# Patient Record
Sex: Female | Born: 1960 | Race: White | Hispanic: No | Marital: Married | Smoking: Current every day smoker
Health system: Southern US, Community
[De-identification: ages and names within clinical notes are randomized; demographics above are authoritative.]

## PROBLEM LIST (undated history)

## (undated) DIAGNOSIS — K219 Gastro-esophageal reflux disease without esophagitis: Secondary | ICD-10-CM

## (undated) DIAGNOSIS — M797 Fibromyalgia: Secondary | ICD-10-CM

## (undated) DIAGNOSIS — M810 Age-related osteoporosis without current pathological fracture: Secondary | ICD-10-CM

## (undated) DIAGNOSIS — M1990 Unspecified osteoarthritis, unspecified site: Secondary | ICD-10-CM

## (undated) DIAGNOSIS — M4802 Spinal stenosis, cervical region: Secondary | ICD-10-CM

## (undated) DIAGNOSIS — K2210 Ulcer of esophagus without bleeding: Secondary | ICD-10-CM

## (undated) DIAGNOSIS — E039 Hypothyroidism, unspecified: Secondary | ICD-10-CM

## (undated) DIAGNOSIS — K589 Irritable bowel syndrome without diarrhea: Secondary | ICD-10-CM

## (undated) DIAGNOSIS — Z87448 Personal history of other diseases of urinary system: Secondary | ICD-10-CM

## (undated) DIAGNOSIS — G43909 Migraine, unspecified, not intractable, without status migrainosus: Secondary | ICD-10-CM

## (undated) HISTORY — PX: ABDOMINAL HYSTERECTOMY: SHX81

## (undated) HISTORY — DX: Unspecified osteoarthritis, unspecified site: M19.90

## (undated) HISTORY — PX: CARPAL TUNNEL RELEASE: SHX101

## (undated) HISTORY — PX: LAPAROSCOPIC CHOLECYSTECTOMY: SUR755

## (undated) HISTORY — DX: Age-related osteoporosis without current pathological fracture: M81.0

## (undated) HISTORY — DX: Fibromyalgia: M79.7

## (undated) HISTORY — PX: COLONOSCOPY W/ BIOPSIES AND POLYPECTOMY: SHX1376

## (undated) HISTORY — PX: SHOULDER SURGERY: SHX246

## (undated) HISTORY — DX: Personal history of other diseases of urinary system: Z87.448

## (undated) HISTORY — DX: Migraine, unspecified, not intractable, without status migrainosus: G43.909

## (undated) HISTORY — DX: Gastro-esophageal reflux disease without esophagitis: K21.9

## (undated) HISTORY — PX: OTHER SURGICAL HISTORY: SHX169

## (undated) HISTORY — PX: APPENDECTOMY: SHX54

---

## 2016-08-20 ENCOUNTER — Ambulatory Visit (INDEPENDENT_AMBULATORY_CARE_PROVIDER_SITE_OTHER): Payer: 59 | Admitting: Orthopaedic Surgery

## 2016-08-20 ENCOUNTER — Encounter (INDEPENDENT_AMBULATORY_CARE_PROVIDER_SITE_OTHER): Payer: Self-pay | Admitting: Orthopaedic Surgery

## 2016-08-20 VITALS — BP 127/83 | HR 85 | Ht 66.0 in | Wt 170.0 lb

## 2016-08-20 DIAGNOSIS — M4722 Other spondylosis with radiculopathy, cervical region: Secondary | ICD-10-CM | POA: Diagnosis not present

## 2016-08-20 NOTE — Progress Notes (Signed)
Office Visit Note/CONSULT REQUESTING DR. Ceasar MonsGARY KUMZA   Patient: Miranda Proctor           Date of Birth: [birth year]           MRN: [id number] Visit Date: 08/20/2016              Requested by: Cindee SaltGary Kuzma MD PCP: No primary care provider on file.   Assessment & Plan: Visit Diagnoses:  1. Other spondylosis with radiculopathy, cervical region     Plan: We'll proceed with a cervical MRI scan and evaluate her for any compressive lesions correspond with her bilateral upper extremity symptoms. Office follow-up after MRI for review. Thank you for the opportunity to see her in consultation.  Follow-Up Instructions: No Follow-up on file.   Orders:  Orders Placed This Encounter  Procedures  . MR Cervical Spine w/o contrast   No orders of the defined types were placed in this encounter.     Procedures: No procedures performed   Clinical Data: No additional findings.   Subjective: Chief Complaint  Patient presents with  . Neck - Pain    Patient is referred by Dr. Merlyn LotKuzma today for cervical spine. She has been having pain, numbness, and tingling in bilateral hands. She states that the left hand=right hand. 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 She has had these symptoms x one year but they seem to be getting worse.  She does state that she has some neck pain. She was in an accident with a horse when she was 56 yo and injured her neck. She has a weak spot there and states that it always burns. She has tried otc meds, physical therapy, and braces on her wrists. She has also had bilateral shoulder surgery, tendon transfer on left, releases on both right and left hands, and a cubital release right elbow. None of this has helped with her pain.  Plain radiograph showed the spondylosis of cervical spine and these were reviewed on disc from Dr. Merrilee SeashoreKuzma's office. Her symptoms are symmetrical in the upper extremity she denies any lower extremity  numbness or weakness no chills or fever. She's been on the Celebrex in the past, Neurontin without relief. Due to pain that the patient has states she's not been able to work and states she is disabled. She rates her hand symptoms as severe.  Review of Systems  Constitutional: Negative for chills and diaphoresis.  HENT: Negative for ear discharge, ear pain and nosebleeds.   Eyes: Negative for discharge and visual disturbance.  Respiratory: Negative for cough, choking and shortness of breath.   Cardiovascular: Negative for chest pain and palpitations.  Gastrointestinal: Negative for abdominal distention and abdominal pain.  Endocrine: Negative for cold intolerance and heat intolerance.  Genitourinary: Negative for flank pain and hematuria.  Musculoskeletal:       Previous bilateral shoulder surgery with recurrent tearing. Tendon transfers on the left the wrist. 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 She states her ongoing pain symptoms do not seem to be improved by the surgery she's had.  Skin: Negative for rash and wound.  Neurological: Negative for seizures and speech difficulty.  Hematological: Negative for adenopathy. Does not bruise/bleed easily.  Psychiatric/Behavioral: Negative for agitation, hallucinations and suicidal ideas.     Objective: Vital Signs: BP 127/83   Pulse 85   Ht 5\' 6"  (1.676 m)  Wt 170 lb (77.1 kg)   BMI 27.44 kg/m   Physical Exam  Constitutional: She is oriented to person, place, and time. She appears well-developed.  HENT:  Head: Normocephalic.  Right Ear: External ear normal.  Left Ear: External ear normal.  Eyes: Pupils are equal, round, and reactive to light.  Neck: No tracheal deviation present. No thyromegaly present.  Cardiovascular: Normal rate.   Pulmonary/Chest: Effort normal.  Abdominal: Soft.  Musculoskeletal:  Patient bilateral brachial plexus tenderness which  causes her to withdraw and she states that pain is severe. Mild tenderness of the supraspinatus. No supraspinous atrophy. Negative impingement both shoulders. Mild to moderate Spurling present both right and left. Upper extremity reflexes are 2+ and symmetrical lower extremity reflexes are normal. No clonus normal heel toe gait.  Neurological: She is alert and oriented to person, place, and time.  Skin: Skin is warm and dry.  Psychiatric: She has a normal mood and affect. Her behavior is normal.    Ortho Exam  Specialty Comments:  No specialty comments available.  Imaging: No results found.   PMFS History: Patient Active Problem List   Diagnosis Date Noted  . Other spondylosis with radiculopathy, cervical region 08/23/2016   Past Medical History:  Diagnosis Date  . Acid reflux   . Arthritis   . Fibromyalgia   . History of bladder problems   . Migraines   . Osteoarthritis   . Osteoporosis     No family history on file.  Past Surgical History:  Procedure Laterality Date  . APPENDECTOMY    . cubital release Right   . LAPAROSCOPIC CHOLECYSTECTOMY    . SHOULDER SURGERY Left   . SHOULDER SURGERY Right   . SHOULDER SURGERY Right    Social History   Occupational History  . Not on file.   Social History Main Topics  . Smoking status: Current Every Day Smoker  . Smokeless tobacco: Never Used  . Alcohol use No  . Drug use: No  . Sexual activity: Not on file

## 2016-08-23 DIAGNOSIS — M4722 Other spondylosis with radiculopathy, cervical region: Secondary | ICD-10-CM | POA: Insufficient documentation

## 2016-08-24 ENCOUNTER — Other Ambulatory Visit (INDEPENDENT_AMBULATORY_CARE_PROVIDER_SITE_OTHER): Payer: Self-pay | Admitting: Orthopaedic Surgery

## 2016-08-27 ENCOUNTER — Ambulatory Visit
Admission: RE | Admit: 2016-08-27 | Discharge: 2016-08-27 | Disposition: A | Discharge status: Home / Self Care | Payer: 59 | Source: Ambulatory Visit | Attending: Orthopaedic Surgery | Admitting: Orthopaedic Surgery

## 2016-08-27 DIAGNOSIS — M4722 Other spondylosis with radiculopathy, cervical region: Secondary | ICD-10-CM

## 2016-09-01 ENCOUNTER — Encounter (INDEPENDENT_AMBULATORY_CARE_PROVIDER_SITE_OTHER): Payer: Self-pay | Admitting: Orthopaedic Surgery

## 2016-09-01 ENCOUNTER — Ambulatory Visit (INDEPENDENT_AMBULATORY_CARE_PROVIDER_SITE_OTHER): Payer: 59 | Admitting: Orthopaedic Surgery

## 2016-09-01 VITALS — BP 133/93 | HR 69 | Ht 66.0 in | Wt 170.0 lb

## 2016-09-01 DIAGNOSIS — M4722 Other spondylosis with radiculopathy, cervical region: Secondary | ICD-10-CM | POA: Diagnosis not present

## 2016-09-01 NOTE — Progress Notes (Signed)
Office Visit Note   Patient: Miranda Proctor           Date of Birth: Jul 09, 1961           MRN: [id number] Visit Date: 09/01/2016              Requested by: No referring provider defined for this encounter. PCP: Primus BravoGIBSON,TIFFANY, NP   Assessment & Plan: Visit Diagnoses:  1. Other spondylosis with radiculopathy, cervical region           Cervical spondylosis C5-6, C6-7 with stenosis.  Plan: We discussed the options with her mother sure added additional history. She rates her pain is severe and constant at 6 out of 10 and at times up to 8 or 9 out of 10 great difficulty rotating her neck. Pain radiates into her hand primarily radial side of her hand. We reviewed her images with patient and also her mother. I gave her copy of the report we discussed options. Surgical option B2 level cervical discectomy and fusion allograft and plate overnight stay in the hospital soft collar 6 weeks. Risk of dysphonia and dysphagia discussed. Potential for pseudoarthrosis potential posterior fusion of the anterior procedure did not completely heal. Questions were elicited and answered she understands request to proceed. Limited images of patient to have the surgery discussed use of the allograft, plate and the surgical technique.  Follow-Up Instructions: No Follow-up on file.   Orders:  No orders of the defined types were placed in this encounter.  No orders of the defined types were placed in this encounter.     Procedures: No procedures performed   Clinical Data: No additional findings.   Subjective: Chief Complaint  Patient presents with  . Neck - Follow-up    Patient returns for MRI cervical spine review. She states that there has been no change in her symptoms. She is a 6/10 pain today.     Review of Systems 14 point review of systems updated unchanged from 08/20/2016. Of note previous shoulder surgeries 2 in the right history of scar tissue. Previous ligament reconstruction left  thumb. Bilateral carpal tunnel releases. Cubital tunnel surgery. History of chronic back pain symptoms. Negative for CVA, MI, seizures. Objective: Vital Signs: BP (!) 133/93   Pulse 69   Ht 5\' 6"  (1.676 m)   Wt 170 lb (77.1 kg)   BMI 27.44 kg/m   Physical Exam  Constitutional: She is oriented to person, place, and time. She appears well-developed.  HENT:  Head: Normocephalic.  Right Ear: External ear normal.  Left Ear: External ear normal.  Eyes: Pupils are equal, round, and reactive to light.  Neck: No tracheal deviation present. No thyromegaly present.  Cardiovascular: Normal rate.   Pulmonary/Chest: Effort normal.  Abdominal: Soft.  Musculoskeletal:  Well-healed scars ME from previous shoulder surgery carpal tunnel release thumb ligamentous reconstruction left. Reflexes are 2+ and symmetrical she has been complex tenderness on the right than left. Decreased sensation radial side of her hand. Pain with cervical compression some relief with distraction. No lower extremity hyperreflexia and normal heel toe gait.  Neurological: She is alert and oriented to person, place, and time.  Skin: Skin is warm and dry.  Psychiatric: She has a normal mood and affect. Her behavior is normal.    Ortho Exam  Specialty Comments:  No specialty comments available.  Imaging: No results found. 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 Cervical neck pain, bilateral shoulder pain. Initial encounter.  EXAM: MRI CERVICAL SPINE WITHOUT CONTRAST  TECHNIQUE: Multiplanar, multisequence MR imaging of the cervical spine was performed. No intravenous contrast was administered.  COMPARISON:  None.  FINDINGS: Alignment: Straightening of upper cervical lordosis, mild reversal of lower cervical lordosis. Subtle anterolisthesis of C4 on C5.  Vertebrae: No marrow  edema or evidence of acute osseous abnormality. Probable benign hemangioma of the C7 vertebral body with atypical signal (series 3 image 6 and series 4, image 6).  Cord: Spinal cord signal is within normal limits at all visualized levels.  Posterior Fossa, vertebral arteries, paraspinal tissues: Cervicomedullary junction is within normal limits. Negative visualized posterior fossa structures. Preserved vascular flow voids in the neck. Incidental lingual tonsillar hypertrophy. The palatine tonsils probably are surgically absent. Negative neck soft tissues.  Disc levels:  C2-C3:  Negative.  C3-C4: Mild disc bulge and uncovertebral hypertrophy. Mild to moderate bilateral C4 foraminal stenosis.  C4-C5:  Negative.  C5-C6: Mild disc space loss. Circumferential disc bulge. Small broad-based central disc protrusion with annular fissure (series 5, image 16). Mild spinal stenosis with up to mild ventral spinal cord mass effect. No cord signal abnormality. Right greater than left uncovertebral hypertrophy. Moderate right and mild left C6 foraminal stenosis (series 6, image 15).  C6-C7: Disc space loss with circumferential disc bulge and superimposed broad-based posterior disc protrusion with annular fissure (series 5, image 20). Mild ligament flavum hypertrophy. Spinal stenosis with up to mild ventral cord mass effect. Bilateral uncovertebral hypertrophy and foraminal disc. Moderate to severe bilateral C7 foraminal stenosis.  C7-T1:  Negative.  No upper thoracic spinal stenosis. Mild to moderate upper thoracic facet hypertrophy.  IMPRESSION: 1. Disc and endplate degeneration at C5-C6 and C6-C7 with mild spinal stenosis at both levels. Up to mild ventral spinal cord mass effect without cord signal abnormality. 2. Associated moderate right C6 and moderate to severe bilateral C7 neural foraminal stenosis. 3. Disc and endplate degeneration at C3-C4 with mild to  moderate bilateral C4 foraminal stenosis.   Electronically Signed   By: Odessa Fleming M.D.   On: 08/27/2016 15:28      PMFS History: Patient Active Problem List   Diagnosis Date Noted  . Other spondylosis with radiculopathy, cervical region 08/23/2016   Past Medical History:  Diagnosis Date  . Acid reflux   . Arthritis   . Fibromyalgia   . History of bladder problems   . Migraines   . Osteoarthritis   . Osteoporosis     No family history on file.  Past Surgical History:  Procedure Laterality Date  . APPENDECTOMY    . cubital release Right   . LAPAROSCOPIC CHOLECYSTECTOMY    . SHOULDER SURGERY Left   . SHOULDER SURGERY Right   . SHOULDER SURGERY Right    Social History   Occupational History  . Not on file.   Social History Main Topics  . Smoking status: Current Every Day Smoker  . Smokeless tobacco: Never Used  . Alcohol use No  . Drug use: No  . Sexual activity: Not on file

## 2016-09-20 ENCOUNTER — Other Ambulatory Visit (INDEPENDENT_AMBULATORY_CARE_PROVIDER_SITE_OTHER): Payer: Self-pay | Admitting: Orthopaedic Surgery

## 2016-09-21 ENCOUNTER — Encounter (HOSPITAL_COMMUNITY): Payer: Self-pay

## 2016-09-21 ENCOUNTER — Encounter (HOSPITAL_COMMUNITY)
Admission: RE | Admit: 2016-09-21 | Discharge: 2016-09-21 | Disposition: A | Discharge status: Home / Self Care | Payer: 59 | Source: Ambulatory Visit | Attending: Orthopaedic Surgery | Admitting: Orthopaedic Surgery

## 2016-09-21 DIAGNOSIS — M797 Fibromyalgia: Secondary | ICD-10-CM | POA: Diagnosis not present

## 2016-09-21 DIAGNOSIS — Z01812 Encounter for preprocedural laboratory examination: Secondary | ICD-10-CM

## 2016-09-21 DIAGNOSIS — Z880 Allergy status to penicillin: Secondary | ICD-10-CM | POA: Diagnosis not present

## 2016-09-21 DIAGNOSIS — Z01810 Encounter for preprocedural cardiovascular examination: Secondary | ICD-10-CM | POA: Insufficient documentation

## 2016-09-21 DIAGNOSIS — Z882 Allergy status to sulfonamides status: Secondary | ICD-10-CM | POA: Diagnosis not present

## 2016-09-21 DIAGNOSIS — K219 Gastro-esophageal reflux disease without esophagitis: Secondary | ICD-10-CM | POA: Diagnosis not present

## 2016-09-21 DIAGNOSIS — E039 Hypothyroidism, unspecified: Secondary | ICD-10-CM | POA: Diagnosis not present

## 2016-09-21 DIAGNOSIS — F17200 Nicotine dependence, unspecified, uncomplicated: Secondary | ICD-10-CM | POA: Diagnosis not present

## 2016-09-21 DIAGNOSIS — Z79899 Other long term (current) drug therapy: Secondary | ICD-10-CM | POA: Diagnosis not present

## 2016-09-21 DIAGNOSIS — M50222 Other cervical disc displacement at C5-C6 level: Secondary | ICD-10-CM | POA: Diagnosis not present

## 2016-09-21 DIAGNOSIS — M47812 Spondylosis without myelopathy or radiculopathy, cervical region: Secondary | ICD-10-CM | POA: Diagnosis present

## 2016-09-21 DIAGNOSIS — M4802 Spinal stenosis, cervical region: Secondary | ICD-10-CM | POA: Diagnosis not present

## 2016-09-21 DIAGNOSIS — Z881 Allergy status to other antibiotic agents status: Secondary | ICD-10-CM | POA: Diagnosis not present

## 2016-09-21 HISTORY — DX: Ulcer of esophagus without bleeding: K22.10

## 2016-09-21 HISTORY — DX: Spinal stenosis, cervical region: M48.02

## 2016-09-21 HISTORY — DX: Hypothyroidism, unspecified: E03.9

## 2016-09-21 HISTORY — DX: Irritable bowel syndrome, unspecified: K58.9

## 2016-09-21 LAB — COMPREHENSIVE METABOLIC PANEL
ALK PHOS: 88 U/L (ref 38–126)
ALT: 18 U/L (ref 14–54)
AST: 47 U/L — ABNORMAL HIGH (ref 15–41)
Albumin: 4.2 g/dL (ref 3.5–5.0)
Anion gap: 9 (ref 5–15)
BUN: 18 mg/dL (ref 6–20)
CALCIUM: 9.7 mg/dL (ref 8.9–10.3)
CO2: 28 mmol/L (ref 22–32)
CREATININE: 0.87 mg/dL (ref 0.44–1.00)
Chloride: 102 mmol/L (ref 101–111)
Glucose, Bld: 98 mg/dL (ref 65–99)
Potassium: 5.2 mmol/L — ABNORMAL HIGH (ref 3.5–5.1)
Sodium: 139 mmol/L (ref 135–145)
Total Bilirubin: 1.5 mg/dL — ABNORMAL HIGH (ref 0.3–1.2)
Total Protein: 7.1 g/dL (ref 6.5–8.1)

## 2016-09-21 LAB — URINALYSIS, ROUTINE W REFLEX MICROSCOPIC
Bilirubin Urine: NEGATIVE
GLUCOSE, UA: NEGATIVE mg/dL
HGB URINE DIPSTICK: NEGATIVE
Ketones, ur: NEGATIVE mg/dL
Leukocytes, UA: NEGATIVE
Nitrite: NEGATIVE
PROTEIN: NEGATIVE mg/dL
Specific Gravity, Urine: 1.019 (ref 1.005–1.030)
pH: 5 (ref 5.0–8.0)

## 2016-09-21 LAB — CBC
HEMATOCRIT: 42.9 % (ref 36.0–46.0)
HEMOGLOBIN: 14.6 g/dL (ref 12.0–15.0)
MCH: 31.1 pg (ref 26.0–34.0)
MCHC: 34 g/dL (ref 30.0–36.0)
MCV: 91.5 fL (ref 78.0–100.0)
Platelets: 368 10*3/uL (ref 150–400)
RBC: 4.69 MIL/uL (ref 3.87–5.11)
RDW: 13.5 % (ref 11.5–15.5)
WBC: 12.8 10*3/uL — ABNORMAL HIGH (ref 4.0–10.5)

## 2016-09-21 LAB — SURGICAL PCR SCREEN
MRSA, PCR: NEGATIVE
Staphylococcus aureus: NEGATIVE

## 2016-09-21 LAB — APTT: aPTT: 29 seconds (ref 24–36)

## 2016-09-21 LAB — PROTIME-INR
INR: 1.01
PROTHROMBIN TIME: 13.3 s (ref 11.4–15.2)

## 2016-09-21 NOTE — Progress Notes (Signed)
Pt denies SOB, chest pain, and being under the care of a cardiologist. Pt stated that a stress test was performed > 10 years ago. Pt denies having a cardiac cath and echo. Pt denies having recent labs. Pt denies having an EKG and chest x ray within the last year.

## 2016-09-21 NOTE — Pre-Procedure Instructions (Signed)
    Miranda Proctor  09/21/2016      [id number]- [place], [place] - [address] [place] [id number] Phone: [phone] Fax: [phone]    Your procedure is scheduled on Friday, September 24, 2016  [facility]:30 [facility].M.  Call this number if you have problems the morning of surgery:  [phone]   Remember:  Do not eat food or drink liquids after midnight Thursday, September 23, 2016  Take these medicines the morning of surgery with A SIP OF WATER  :levothyroxine (SYNTHROID), if needed: RESTASIS eye drops Stop taking Aspirin, vitamins, fish oil and herbal medications. Do not take any NSAIDs ie: Ibuprofen, Advil, Naproxen, BC and Goody Powder and celecoxib (CELEBREX) and VOLTAREN) 1 % GEL; stop now.  Do not wear jewelry, make-up or nail polish.  Do not wear lotions, powders, or perfumes, or deoderant.  Do not shave 48 hours prior to surgery.    Do not bring valuables to the hospital.  [facility] is not responsible for any belongings or valuables.  Contacts, dentures or bridgework may not be worn into surgery.  Leave your suitcase in the car.  After surgery it may be brought to your room. For patients admitted to the hospital, discharge time will be determined by your treatment team. Patients discharged the day of surgery will not be allowed to drive home.  Special instructions: Shower the night before surgery and the morning of surgery with CHG. Please read over the following fact sheets that you were given. Pain Booklet, Coughing and Deep Breathing, MRSA Information and Surgical Site Infection Prevention

## 2016-09-22 ENCOUNTER — Ambulatory Visit (INDEPENDENT_AMBULATORY_CARE_PROVIDER_SITE_OTHER): Payer: 59 | Admitting: Orthopaedic Surgery

## 2016-09-24 ENCOUNTER — Ambulatory Visit (HOSPITAL_COMMUNITY): Payer: 59 | Admitting: Certified Registered Nurse Anesthetist

## 2016-09-24 ENCOUNTER — Ambulatory Visit (HOSPITAL_COMMUNITY): Payer: 59

## 2016-09-24 ENCOUNTER — Encounter (HOSPITAL_COMMUNITY): Payer: Self-pay | Admitting: *Deleted

## 2016-09-24 ENCOUNTER — Encounter (HOSPITAL_COMMUNITY)
Admission: RE | Disposition: A | Discharge status: Home / Self Care | Payer: Self-pay | Source: Ambulatory Visit | Attending: Orthopaedic Surgery

## 2016-09-24 ENCOUNTER — Observation Stay (HOSPITAL_COMMUNITY)
Admission: RE | Admit: 2016-09-24 | Discharge: 2016-09-25 | Disposition: A | Discharge status: Home / Self Care | Payer: 59 | Source: Ambulatory Visit | Attending: Orthopaedic Surgery | Admitting: Orthopaedic Surgery

## 2016-09-24 DIAGNOSIS — M50222 Other cervical disc displacement at C5-C6 level: Secondary | ICD-10-CM | POA: Insufficient documentation

## 2016-09-24 DIAGNOSIS — Z881 Allergy status to other antibiotic agents status: Secondary | ICD-10-CM | POA: Insufficient documentation

## 2016-09-24 DIAGNOSIS — M4802 Spinal stenosis, cervical region: Secondary | ICD-10-CM | POA: Diagnosis present

## 2016-09-24 DIAGNOSIS — Z880 Allergy status to penicillin: Secondary | ICD-10-CM | POA: Insufficient documentation

## 2016-09-24 DIAGNOSIS — E039 Hypothyroidism, unspecified: Secondary | ICD-10-CM | POA: Insufficient documentation

## 2016-09-24 DIAGNOSIS — M47812 Spondylosis without myelopathy or radiculopathy, cervical region: Principal | ICD-10-CM | POA: Insufficient documentation

## 2016-09-24 DIAGNOSIS — Z882 Allergy status to sulfonamides status: Secondary | ICD-10-CM | POA: Insufficient documentation

## 2016-09-24 DIAGNOSIS — K219 Gastro-esophageal reflux disease without esophagitis: Secondary | ICD-10-CM | POA: Insufficient documentation

## 2016-09-24 DIAGNOSIS — F17200 Nicotine dependence, unspecified, uncomplicated: Secondary | ICD-10-CM | POA: Insufficient documentation

## 2016-09-24 DIAGNOSIS — Z419 Encounter for procedure for purposes other than remedying health state, unspecified: Secondary | ICD-10-CM

## 2016-09-24 DIAGNOSIS — Z79899 Other long term (current) drug therapy: Secondary | ICD-10-CM | POA: Insufficient documentation

## 2016-09-24 DIAGNOSIS — M797 Fibromyalgia: Secondary | ICD-10-CM | POA: Insufficient documentation

## 2016-09-24 HISTORY — PX: ANTERIOR CERVICAL DECOMP/DISCECTOMY FUSION: SHX1161

## 2016-09-24 SURGERY — ANTERIOR CERVICAL DECOMPRESSION/DISCECTOMY FUSION 2 LEVELS
Anesthesia: General

## 2016-09-24 MED ORDER — HYDROMORPHONE HCL 1 MG/ML IJ SOLN
0.2500 mg | INTRAMUSCULAR | Status: DC | PRN
  Administered 2016-09-24 (×3): 0.5 mg via INTRAVENOUS

## 2016-09-24 MED ORDER — LEVOTHYROXINE SODIUM 88 MCG PO TABS
88.0000 ug | ORAL_TABLET | Freq: Every day | ORAL | Status: DC
  Filled 2016-09-24: qty 1

## 2016-09-24 MED ORDER — LIDOCAINE HCL (CARDIAC) 20 MG/ML IV SOLN
INTRAVENOUS | Status: DC | PRN
Start: 2016-09-24 — End: 2016-09-24
  Administered 2016-09-24: 60 mg via INTRAVENOUS

## 2016-09-24 MED ORDER — SODIUM CHLORIDE 0.9 % IV SOLN: 250.0000 mL | INTRAVENOUS | Status: DC

## 2016-09-24 MED ORDER — ACETAMINOPHEN 325 MG PO TABS
650.0000 mg | ORAL_TABLET | ORAL | Status: DC | PRN
  Administered 2016-09-24 – 2016-09-25 (×3): 650 mg via ORAL
  Filled 2016-09-24 (×3): qty 2

## 2016-09-24 MED ORDER — MIDAZOLAM HCL 2 MG/2ML IJ SOLN
INTRAMUSCULAR | Status: AC
  Filled 2016-09-24: qty 2

## 2016-09-24 MED ORDER — HYDROMORPHONE HCL 2 MG/ML IJ SOLN
0.5000 mg | INTRAMUSCULAR | Status: DC | PRN
  Administered 2016-09-24: 0.5 mg via INTRAVENOUS
  Filled 2016-09-24: qty 1

## 2016-09-24 MED ORDER — PROMETHAZINE HCL 25 MG/ML IJ SOLN: 6.2500 mg | INTRAMUSCULAR | Status: DC | PRN

## 2016-09-24 MED ORDER — HYDROMORPHONE HCL 1 MG/ML IJ SOLN
INTRAMUSCULAR | Status: AC
  Filled 2016-09-24: qty 0.5

## 2016-09-24 MED ORDER — VITAMIN D 1000 UNITS PO TABS: 2000.0000 [IU] | ORAL_TABLET | Freq: Every day | ORAL | Status: DC

## 2016-09-24 MED ORDER — CHLORHEXIDINE GLUCONATE 4 % EX LIQD: 60.0000 mL | Freq: Once | CUTANEOUS | Status: DC

## 2016-09-24 MED ORDER — DEXTROSE 5 % IV SOLN
500.0000 mg | Freq: Four times a day (QID) | INTRAVENOUS | Status: DC | PRN
  Filled 2016-09-24: qty 5

## 2016-09-24 MED ORDER — GABAPENTIN 300 MG PO CAPS: 300.0000 mg | ORAL_CAPSULE | Freq: Every day | ORAL | Status: DC

## 2016-09-24 MED ORDER — PROPOFOL 10 MG/ML IV BOLUS
INTRAVENOUS | Status: DC | PRN
  Administered 2016-09-24: 160 mg via INTRAVENOUS

## 2016-09-24 MED ORDER — DEXAMETHASONE SODIUM PHOSPHATE 4 MG/ML IJ SOLN
INTRAMUSCULAR | Status: DC | PRN
  Administered 2016-09-24: 10 mg via INTRAVENOUS

## 2016-09-24 MED ORDER — PHENYLEPHRINE HCL 10 MG/ML IJ SOLN
INTRAVENOUS | Status: DC | PRN
  Administered 2016-09-24: 15 ug/min via INTRAVENOUS

## 2016-09-24 MED ORDER — SUGAMMADEX SODIUM 200 MG/2ML IV SOLN
INTRAVENOUS | Status: DC | PRN
  Administered 2016-09-24: 200 mg via INTRAVENOUS

## 2016-09-24 MED ORDER — FESOTERODINE FUMARATE ER 4 MG PO TB24
4.0000 mg | ORAL_TABLET | Freq: Every day | ORAL | Status: DC
  Filled 2016-09-24: qty 1

## 2016-09-24 MED ORDER — SODIUM CHLORIDE 0.9 % IV SOLN
INTRAVENOUS | Status: DC
  Administered 2016-09-24: 75 mL/h via INTRAVENOUS
  Administered 2016-09-25: 01:00:00 via INTRAVENOUS

## 2016-09-24 MED ORDER — PROMETHAZINE HCL 25 MG/ML IJ SOLN
12.5000 mg | Freq: Four times a day (QID) | INTRAMUSCULAR | Status: DC | PRN
  Administered 2016-09-24 – 2016-09-25 (×2): 12.5 mg via INTRAVENOUS
  Filled 2016-09-24 (×2): qty 1

## 2016-09-24 MED ORDER — ACETAMINOPHEN 650 MG RE SUPP: 650.0000 mg | RECTAL | Status: DC | PRN

## 2016-09-24 MED ORDER — EPHEDRINE SULFATE 50 MG/ML IJ SOLN
INTRAMUSCULAR | Status: DC | PRN
  Administered 2016-09-24: 5 mg via INTRAVENOUS

## 2016-09-24 MED ORDER — DOCUSATE SODIUM 100 MG PO CAPS
100.0000 mg | ORAL_CAPSULE | Freq: Two times a day (BID) | ORAL | Status: DC
  Filled 2016-09-24: qty 1

## 2016-09-24 MED ORDER — PROPOFOL 10 MG/ML IV BOLUS
INTRAVENOUS | Status: AC
  Filled 2016-09-24: qty 20

## 2016-09-24 MED ORDER — ROCURONIUM BROMIDE 100 MG/10ML IV SOLN
INTRAVENOUS | Status: DC | PRN
  Administered 2016-09-24: 10 mg via INTRAVENOUS
  Administered 2016-09-24: 50 mg via INTRAVENOUS

## 2016-09-24 MED ORDER — MIDAZOLAM HCL 5 MG/5ML IJ SOLN
INTRAMUSCULAR | Status: DC | PRN
  Administered 2016-09-24: 2 mg via INTRAVENOUS

## 2016-09-24 MED ORDER — EPINEPHRINE PF 1 MG/ML IJ SOLN
INTRAMUSCULAR | Status: AC
  Filled 2016-09-24: qty 1

## 2016-09-24 MED ORDER — OXYCODONE HCL 5 MG PO TABS: 5.0000 mg | ORAL_TABLET | Freq: Four times a day (QID) | ORAL | Status: DC | PRN

## 2016-09-24 MED ORDER — CYCLOSPORINE 0.05 % OP EMUL
1.0000 [drp] | Freq: Two times a day (BID) | OPHTHALMIC | Status: DC | PRN
  Filled 2016-09-24: qty 1

## 2016-09-24 MED ORDER — FENTANYL CITRATE (PF) 100 MCG/2ML IJ SOLN
INTRAMUSCULAR | Status: AC
  Filled 2016-09-24: qty 4

## 2016-09-24 MED ORDER — FENTANYL CITRATE (PF) 100 MCG/2ML IJ SOLN
INTRAMUSCULAR | Status: DC | PRN
  Administered 2016-09-24 (×2): 50 ug via INTRAVENOUS
  Administered 2016-09-24: 100 ug via INTRAVENOUS

## 2016-09-24 MED ORDER — METHOCARBAMOL 500 MG PO TABS: 500.0000 mg | ORAL_TABLET | Freq: Four times a day (QID) | ORAL | Status: DC | PRN

## 2016-09-24 MED ORDER — ONDANSETRON HCL 4 MG PO TABS: 4.0000 mg | ORAL_TABLET | Freq: Four times a day (QID) | ORAL | Status: DC | PRN

## 2016-09-24 MED ORDER — KETOROLAC TROMETHAMINE 30 MG/ML IJ SOLN: 30.0000 mg | Freq: Four times a day (QID) | INTRAMUSCULAR | Status: DC | PRN

## 2016-09-24 MED ORDER — LACTATED RINGERS IV SOLN
INTRAVENOUS | Status: DC | PRN
  Administered 2016-09-24 (×2): via INTRAVENOUS

## 2016-09-24 MED ORDER — BUPIVACAINE-EPINEPHRINE 0.25% -1:200000 IJ SOLN
INTRAMUSCULAR | Status: DC | PRN
  Administered 2016-09-24: 5 mL

## 2016-09-24 MED ORDER — ONDANSETRON HCL 4 MG/2ML IJ SOLN
INTRAMUSCULAR | Status: DC | PRN
  Administered 2016-09-24: 4 mg via INTRAVENOUS

## 2016-09-24 MED ORDER — HEMOSTATIC AGENTS (NO CHARGE) OPTIME
TOPICAL | Status: DC | PRN
  Administered 2016-09-24: 1 via TOPICAL

## 2016-09-24 MED ORDER — PHENOL 1.4 % MT LIQD: 1.0000 | OROMUCOSAL | Status: DC | PRN

## 2016-09-24 MED ORDER — VANCOMYCIN HCL IN DEXTROSE 1-5 GM/200ML-% IV SOLN
1000.0000 mg | INTRAVENOUS | Status: AC
  Administered 2016-09-24: 1000 mg via INTRAVENOUS
  Filled 2016-09-24: qty 200

## 2016-09-24 MED ORDER — SODIUM CHLORIDE 0.9% FLUSH: 3.0000 mL | INTRAVENOUS | Status: DC | PRN

## 2016-09-24 MED ORDER — POLYETHYLENE GLYCOL 3350 17 G PO PACK
17.0000 g | PACK | Freq: Every day | ORAL | Status: DC
  Filled 2016-09-24: qty 1

## 2016-09-24 MED ORDER — ONDANSETRON HCL 4 MG/2ML IJ SOLN
4.0000 mg | Freq: Four times a day (QID) | INTRAMUSCULAR | Status: DC | PRN
  Administered 2016-09-24 (×2): 4 mg via INTRAVENOUS
  Filled 2016-09-24 (×2): qty 2

## 2016-09-24 MED ORDER — FLUOXETINE HCL 20 MG PO CAPS: 20.0000 mg | ORAL_CAPSULE | Freq: Every day | ORAL | Status: DC

## 2016-09-24 MED ORDER — 0.9 % SODIUM CHLORIDE (POUR BTL) OPTIME
TOPICAL | Status: DC | PRN
  Administered 2016-09-24: 1000 mL

## 2016-09-24 MED ORDER — PANTOPRAZOLE SODIUM 40 MG PO TBEC: 40.0000 mg | DELAYED_RELEASE_TABLET | Freq: Every day | ORAL | Status: DC

## 2016-09-24 MED ORDER — MENTHOL 3 MG MT LOZG: 1.0000 | LOZENGE | OROMUCOSAL | Status: DC | PRN

## 2016-09-24 MED ORDER — BUPIVACAINE HCL (PF) 0.25 % IJ SOLN
INTRAMUSCULAR | Status: AC
  Filled 2016-09-24: qty 30

## 2016-09-24 MED ORDER — SODIUM CHLORIDE 0.9% FLUSH
3.0000 mL | Freq: Two times a day (BID) | INTRAVENOUS | Status: DC
  Administered 2016-09-24 (×2): 3 mL via INTRAVENOUS

## 2016-09-24 SURGICAL SUPPLY — 55 items
BENZOIN TINCTURE PRP APPL 2/3 (GAUZE/BANDAGES/DRESSINGS) ×3 IMPLANT
BIT DRILL SKYLINE 12MM (BIT) ×1 IMPLANT
BLADE CLIPPER SURG (BLADE) IMPLANT
BONE CERV LORDOTIC 14.5X12X7 (Bone Implant) ×6 IMPLANT
BUR ROUND FLUTED 4 SOFT TCH (BURR) ×2 IMPLANT
BUR ROUND FLUTED 4MM SOFT TCH (BURR) ×1
CLOSURE STERI-STRIP 1/2X4 (GAUZE/BANDAGES/DRESSINGS) ×1
CLOSURE WOUND 1/2 X4 (GAUZE/BANDAGES/DRESSINGS) ×1
CLSR STERI-STRIP ANTIMIC 1/2X4 (GAUZE/BANDAGES/DRESSINGS) ×2 IMPLANT
COLLAR CERV LO CONTOUR FIRM DE (SOFTGOODS) ×3 IMPLANT
CORDS BIPOLAR (ELECTRODE) ×3 IMPLANT
COVER MAYO STAND STRL (DRAPES) ×3 IMPLANT
COVER SURGICAL LIGHT HANDLE (MISCELLANEOUS) ×3 IMPLANT
CRADLE DONUT ADULT HEAD (MISCELLANEOUS) ×3 IMPLANT
DRAPE C-ARM 42X72 X-RAY (DRAPES) ×3 IMPLANT
DRAPE MICROSCOPE LEICA (MISCELLANEOUS) ×3 IMPLANT
DRAPE PROXIMA HALF (DRAPES) ×3 IMPLANT
DRILL BIT SKYLINE 12MM (BIT) ×2
DRSG MEPILEX BORDER 4X4 (GAUZE/BANDAGES/DRESSINGS) ×3 IMPLANT
DURAPREP 6ML APPLICATOR 50/CS (WOUND CARE) ×3 IMPLANT
ELECT COATED BLADE 2.86 ST (ELECTRODE) ×3 IMPLANT
ELECT REM PT RETURN 9FT ADLT (ELECTROSURGICAL) ×3
ELECTRODE REM PT RTRN 9FT ADLT (ELECTROSURGICAL) ×1 IMPLANT
EVACUATOR 1/8 PVC DRAIN (DRAIN) ×3 IMPLANT
GAUZE SPONGE 4X4 12PLY STRL (GAUZE/BANDAGES/DRESSINGS) ×3 IMPLANT
GLOVE BIOGEL PI IND STRL 8 (GLOVE) ×3 IMPLANT
GLOVE BIOGEL PI INDICATOR 8 (GLOVE) ×6
GLOVE ORTHO TXT STRL SZ7.5 (GLOVE) ×12 IMPLANT
GOWN STRL REUS W/ TWL LRG LVL3 (GOWN DISPOSABLE) ×2 IMPLANT
GOWN STRL REUS W/ TWL XL LVL3 (GOWN DISPOSABLE) ×2 IMPLANT
GOWN STRL REUS W/TWL 2XL LVL3 (GOWN DISPOSABLE) ×3 IMPLANT
GOWN STRL REUS W/TWL LRG LVL3 (GOWN DISPOSABLE) ×4
GOWN STRL REUS W/TWL XL LVL3 (GOWN DISPOSABLE) ×4
HEAD HALTER (SOFTGOODS) ×3 IMPLANT
HEMOSTAT SURGICEL 2X14 (HEMOSTASIS) IMPLANT
KIT BASIN OR (CUSTOM PROCEDURE TRAY) ×3 IMPLANT
KIT ROOM TURNOVER OR (KITS) ×3 IMPLANT
MANIFOLD NEPTUNE II (INSTRUMENTS) IMPLANT
NEEDLE 25GX 5/8IN NON SAFETY (NEEDLE) ×3 IMPLANT
NS IRRIG 1000ML POUR BTL (IV SOLUTION) ×3 IMPLANT
PACK ORTHO CERVICAL (CUSTOM PROCEDURE TRAY) ×3 IMPLANT
PAD ARMBOARD 7.5X6 YLW CONV (MISCELLANEOUS) ×6 IMPLANT
PATTIES SURGICAL .5 X.5 (GAUZE/BANDAGES/DRESSINGS) IMPLANT
PIN TEMP SKYLINE THREADED (PIN) ×3 IMPLANT
PLATE TWO LEVEL SKYLINE 30MM (Plate) ×3 IMPLANT
RESTRAINT LIMB HOLDER UNIV (RESTRAINTS) ×3 IMPLANT
SCREW VAR SELF TAP SKYLINE 14M (Screw) ×18 IMPLANT
STRIP CLOSURE SKIN 1/2X4 (GAUZE/BANDAGES/DRESSINGS) ×2 IMPLANT
SURGIFLO W/THROMBIN 8M KIT (HEMOSTASIS) ×6 IMPLANT
SUT BONE WAX W31G (SUTURE) ×3 IMPLANT
SUT VIC AB 3-0 X1 27 (SUTURE) ×3 IMPLANT
SUT VICRYL 4-0 PS2 18IN ABS (SUTURE) ×3 IMPLANT
TOWEL OR 17X24 6PK STRL BLUE (TOWEL DISPOSABLE) ×3 IMPLANT
TOWEL OR 17X26 10 PK STRL BLUE (TOWEL DISPOSABLE) ×3 IMPLANT
TRAY FOLEY CATH 16FR SILVER (SET/KITS/TRAYS/PACK) IMPLANT

## 2016-09-24 NOTE — Anesthesia Preprocedure Evaluation (Addendum)
Anesthesia Evaluation  Patient identified by MRN, date of birth, ID band Patient awake    Airway Mallampati: II  TM Distance: >3 FB Neck ROM: Full    Dental  (+) Teeth Intact, Dental Advisory Given   Pulmonary Current Smoker,    breath sounds clear to auscultation       Cardiovascular  Rhythm:Regular Rate:Normal     Neuro/Psych    GI/Hepatic PUD, GERD  ,  Endo/Other  Hypothyroidism   Renal/GU      Musculoskeletal  (+) Arthritis , Fibromyalgia -  Abdominal   Peds  Hematology   Anesthesia Other Findings   Reproductive/Obstetrics                            Anesthesia Physical Anesthesia Plan  ASA: II  Anesthesia Plan: General   Post-op Pain Management:    Induction: Intravenous  Airway Management Planned: Oral ETT  Additional Equipment:   Intra-op Plan:   Post-operative Plan: Extubation in OR  Informed Consent: I have reviewed the patients History and Physical, chart, labs and discussed the procedure including the risks, benefits and alternatives for the proposed anesthesia with the patient or authorized representative who has indicated his/her understanding and acceptance.   Dental advisory given  Plan Discussed with:   Anesthesia Plan Comments:        Anesthesia Quick Evaluation

## 2016-09-24 NOTE — H&P (Addendum)
Chief Complaint: neck and arm pain  History: Patient with hx of C5-6 and C6-7 HNP/stenosis and the above complaint presents today for surgical intervention.  Failed conservative Tx , including NSAIDS, therapy, bracing. Symptoms greater than 6 months.  Past Medical History:  Diagnosis Date  . Acid reflux   . Arthritis   . Cervical stenosis of spine    cervical 5-7  . Esophageal ulcer   . Fibromyalgia   . History of bladder problems   . Hypothyroidism   . IBS (irritable bowel syndrome)   . Migraines   . Osteoarthritis   . Osteoporosis     Allergies  Allergen Reactions  . Penicillins Anaphylaxis    Has patient had a PCN reaction causing immediate rash, facial/tongue/throat swelling, SOB or lightheadedness with hypotension:Yes Has patient had a PCN reaction causing severe rash involving mucus membranes or skin necrosis:Yes Has patient had a PCN reaction that required hospitalization:unsure Has patient had a PCN reaction occurring within the last 10 years:No If all of the above answers are "NO", then may proceed with Cephalosporin use. Childhood reaction.  . Codeine Nausea And Vomiting    Severe with dehyration  . Levofloxacin Swelling and Rash    SWELLING REACTION UNSPECIFIED   . Oxycodone-Acetaminophen     UNSPECIFIED REACTION   . Sulfamethoxazole-Trimethoprim Nausea And Vomiting  . Tramadol-Acetaminophen Nausea And Vomiting    No current facility-administered medications on file prior to encounter.    Current Outpatient Prescriptions on File Prior to Encounter  Medication Sig Dispense Refill  . celecoxib (CELEBREX) 200 MG capsule Take 200 mg by mouth 2 (two) times daily.     . Cholecalciferol (VITAMIN D3) 2000 units TABS Take 2,000 Units by mouth daily at 2 PM.     . gabapentin (NEURONTIN) 300 MG capsule Take 300 mg by mouth daily at 8 pm.     . levothyroxine (SYNTHROID, LEVOTHROID) 88 MCG tablet TAKE 1 TABLET BY MOUTH EVERY DAY AT 7 AM    . NEXIUM 40 MG capsule Take 40  mg by mouth daily at 12 noon.     . tolterodine (DETROL LA) 4 MG 24 hr capsule Take 4 mg by mouth daily at 2 PM.     . diclofenac sodium (VOLTAREN) 1 % GEL Apply 1-2 g topically 4 (four) times daily as needed (for pain.).       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 Cervical neck pain, bilateral shoulder pain. Initial encounter. EXAM: MRI CERVICAL SPINE WITHOUT CONTRAST TECHNIQUE: Multiplanar, multisequence MR imaging of the cervical spine was performed. No intravenous contrast was administered. COMPARISON:  None. FINDINGS: Alignment: Straightening of upper cervical lordosis, mild reversal of lower cervical lordosis. Subtle anterolisthesis of C4 on C5. Vertebrae: No marrow edema or evidence of acute osseous abnormality. Probable benign hemangioma of the C7 vertebral body with atypical signal (series 3 image 6 and series 4, image 6). Cord: Spinal cord signal is within normal limits at all visualized levels. Posterior Fossa, vertebral arteries, paraspinal tissues: Cervicomedullary junction is within normal limits. Negative visualized posterior fossa structures. Preserved vascular flow voids in the neck. Incidental lingual tonsillar hypertrophy. The palatine tonsils probably are surgically absent. Negative neck soft tissues. Disc levels: C2-C3:  Negative. C3-C4:  Mild disc bulge and uncovertebral hypertrophy. Mild to moderate bilateral C4 foraminal stenosis. C4-C5:  Negative. C5-C6: Mild disc space loss. Circumferential disc bulge. Small broad-based central disc protrusion with annular fissure (series 5, image 16). Mild spinal stenosis with up to mild ventral spinal cord mass effect. No cord signal abnormality. Right greater than left uncovertebral hypertrophy. Moderate  right and mild left C6 foraminal stenosis (series 6, image 15). C6-C7: Disc space loss with circumferential disc bulge and superimposed broad-based posterior disc protrusion with annular fissure (series 5, image 20). Mild ligament flavum hypertrophy. Spinal stenosis with up to mild ventral cord mass effect. Bilateral uncovertebral hypertrophy and foraminal disc. Moderate to severe bilateral C7 foraminal stenosis. C7-T1:  Negative. No upper thoracic spinal stenosis. Mild to moderate upper thoracic facet hypertrophy. IMPRESSION: 1. Disc and endplate degeneration at C5-C6 and C6-C7 with mild spinal stenosis at both levels. Up to mild ventral spinal cord mass effect without cord signal abnormality. 2. Associated moderate right C6 and moderate to severe bilateral C7 neural foraminal stenosis. 3. Disc and endplate degeneration at C3-C4 with mild to moderate bilateral C4 foraminal stenosis. Electronically Signed   By: Odessa Fleming M.D.   On: 08/27/2016 15:28    A/P: C5-6 and C6-7 HNP/stenosis   Will proceed with C5-6, C6-7 Anterior Cervical Discectomy and Fusion, Allograft, Plate as scheduled.  Surgical procedure discussed and all questions answered.

## 2016-09-24 NOTE — Anesthesia Postprocedure Evaluation (Signed)
Anesthesia Post Note  Patient: Sharon Sellerdith R Stofko  Procedure(s) Performed: Procedure(s) (LRB): C5-6, C6-7 Anterior Cervical Discectomy and Fusion, Allograft, Plate (N/A)  Patient location during evaluation: PACU Anesthesia Type: General Level of consciousness: awake and alert Pain management: pain level controlled Vital Signs Assessment: post-procedure vital signs reviewed and stable Respiratory status: spontaneous breathing, nonlabored ventilation, respiratory function stable and patient connected to nasal cannula oxygen Cardiovascular status: blood pressure returned to baseline and stable Postop Assessment: no signs of nausea or vomiting Anesthetic complications: no       Last Vitals:  Vitals:   09/24/16 1144 09/24/16 1153  BP:  103/68  Pulse:  89  Resp:  14  Temp: 36.5 C 37.2 C    Last Pain:  Vitals:   09/24/16 1153  TempSrc: Oral  PainSc:                  Kali Ambler DAVID

## 2016-09-24 NOTE — Anesthesia Procedure Notes (Signed)
Procedure Name: Intubation Date/Time: 09/24/2016 7:41 AM Performed by: Oletta Lamas Pre-anesthesia Checklist: Patient identified, Emergency Drugs available, Suction available and Patient being monitored Patient Re-evaluated:Patient Re-evaluated prior to inductionOxygen Delivery Method: Circle System Utilized Preoxygenation: Pre-oxygenation with 100% oxygen Intubation Type: IV induction Ventilation: Mask ventilation without difficulty Laryngoscope Size: Mac and 3 Grade View: Grade I Tube type: Oral Tube size: 7.5 mm Number of attempts: 1 Airway Equipment and Method: Stylet Placement Confirmation: ETT inserted through vocal cords under direct vision,  positive ETCO2 and breath sounds checked- equal and bilateral Secured at: 22 cm Tube secured with: Tape Dental Injury: Teeth and Oropharynx as per pre-operative assessment

## 2016-09-24 NOTE — Interval H&P Note (Signed)
History and Physical Interval Note:  09/24/2016 7:16 AM  Miranda SellerEdith R Proctor  has presented today for surgery, with the diagnosis of Cervical Spondylosis C5-6, C6-7  The various methods of treatment have been discussed with the patient and family. After consideration of risks, benefits and other options for treatment, the patient has consented to  Procedure(s): C5-6, C6-7 Anterior Cervical Discectomy and Fusion, Allograft, Plate (N/A) as a surgical intervention .  The patient's history has been reviewed, patient examined, no change in status, stable for surgery.  I have reviewed the patient's chart and labs.  Questions were answered to the patient's satisfaction.     Eldred MangesMark C Cleavon Goldman

## 2016-09-24 NOTE — Transfer of Care (Signed)
Immediate Anesthesia Transfer of Care Note  Patient: Miranda Proctor  Procedure(s) Performed: Procedure(s): C5-6, C6-7 Anterior Cervical Discectomy and Fusion, Allograft, Plate (N/A)  Patient Location: PACU  Anesthesia Type:General  Level of Consciousness: awake, alert , oriented and patient cooperative  Airway & Oxygen Therapy: Patient Spontanous Breathing and Patient connected to nasal cannula oxygen  Post-op Assessment: Report given to RN and Post -op Vital signs reviewed and stable  Post vital signs: Reviewed and stable  Last Vitals:  Vitals:   09/24/16 0637  BP: 114/80  Pulse: 72  Resp: 18  Temp: 36.9 C    Last Pain:  Vitals:   09/24/16 0634  PainSc: 5       Patients Stated Pain Goal: 2 (09/24/16 [id number])  Complications: No apparent anesthesia complications

## 2016-09-24 NOTE — Progress Notes (Signed)
Orthopedic Tech Progress Note Patient Details:  Miranda Proctor Risk 08-07-1960 [id number]  Ortho Devices Type of Ortho Device: Soft collar Ortho Device/Splint Interventions: Application   Miranda Proctor 09/24/2016, 2:38 PM

## 2016-09-25 DIAGNOSIS — M47812 Spondylosis without myelopathy or radiculopathy, cervical region: Secondary | ICD-10-CM | POA: Diagnosis not present

## 2016-09-25 MED ORDER — PROMETHAZINE HCL 50 MG PO TABS: 25.0000 mg | ORAL_TABLET | Freq: Four times a day (QID) | ORAL | 0 refills | Status: AC | PRN

## 2016-09-25 MED ORDER — HYDROCODONE-ACETAMINOPHEN 5-325 MG PO TABS: 1.0000 | ORAL_TABLET | Freq: Four times a day (QID) | ORAL | 0 refills | Status: AC | PRN

## 2016-09-25 NOTE — Op Note (Signed)
Preop diagnosis: Cervical spondylosis with stenosis C5-6, C6-7  Postop diagnosis: Same  Procedure: C5-6, C6-7 anterior cervical discectomy and fusion, allograft and plate.  Surgeon: Annell GreeningMark Chelsey Kimberley M.D.  Assistant: Zonia KiefJames Owens PA-C medically necessary and present for the entire procedure  Anesthesia: Gen. plus Marcaine skin local 5 mL  Drains: One Hemovac neck  Procedure after standard prepping and draping wrist traction application for pulled down with use of C-arm for neck visualization. Alter traction without weight was applied.  With DuraPrep preoperative antibiotics were given timeout procedure was completed. Neck was squared with towels after sterile skin marker Betadine Steri-Drape sterile Mayo stand at the head thyroid sheets and drapes.  Incision was made starting at the midline extending to the left platysma was divided in line with the fibers. Blunt dissection down the spurs at C5-6 C6-7 and short 25 needle was placed at C5-6 confirmed with lateral C-arm localization and compared to preoperative x-ray which were displayed. Discectomy was performed at C6-7 first which had the most severe pathology. Anterior spurs removed upper microscope was used posterior longitudinal ligament was taken down with the microscope with microdissection 1 and 2 mm Kerrisons. Decompression uncovertebral joint stripped with Clark curettes. Trial sizing's and a 7 mm cortical cancellous allograft was selected. Graft was inserted. Identical procedure performed at the C5-6 level. Complete decompression removal of spurs and bulging disc which was causing the mild spinal stenosis. Dura was intact some Surgiflo was used at each level prior to placing the graft. Skyline Depuy plate was selected. 12 mm screws were placed after C-arm visualization confirming appropriate position. C-arm was checked again endplate was only visualize well on oblique views for the bottom level. Screws and plates were in good position. Irrigation  with saline solution. Plate was locked down with a tiny screwdriver. Hemovac was placed with an in out technique on the left in line with the skin incision. Platysma reapproximated with 3-0 Vicryl after copious irrigation. 4 Vicryl subarticular closure tincture benzoin Steri-Strips soft cervical collar

## 2016-09-25 NOTE — Clinical Social Work Note (Signed)
CSW acknowledges SNF consult. Awaiting PT/OT recommendations.  Charlynn CourtSarah Aubriana Ravelo, CSW [phone]

## 2016-09-25 NOTE — Progress Notes (Signed)
   Subjective: 1 Day Post-Op Procedure(s) (LRB): C5-6, C6-7 Anterior Cervical Discectomy and Fusion, Allograft, Plate (N/A) Patient reports pain as moderate.  Neck pain, incisional pain. N and V from anesthesia and pain meds.( Hx of post op N and V for past surgeries)  Objective: Vital signs in last 24 hours: Temp:  [97.7 F (36.5 C)-99 F (37.2 C)] 98.5 F (36.9 C) (03/03 0537) Pulse Rate:  [78-94] 78 (03/03 0537) Resp:  [9-18] 16 (03/03 0537) BP: (103-121)/(68-79) 105/74 (03/03 0537) SpO2:  [95 %-97 %] 96 % (03/03 0537) Weight:  [170 lb (77.1 kg)] 170 lb (77.1 kg) (03/02 1800)  Intake/Output from previous day: 03/02 0701 - 03/03 0700 In: 2878 [P.O.:420; I.V.:2428] Out: 125 [Blood:125] Intake/Output this shift: No intake/output data recorded.  No results for input(s): HGB in the last 72 hours. No results for input(s): WBC, RBC, HCT, PLT in the last 72 hours. No results for input(s): NA, K, CL, CO2, BUN, CREATININE, GLUCOSE, CALCIUM in the last 72 hours. No results for input(s): LABPT, INR in the last 72 hours.  Neurologically intact No results found.  Assessment/Plan: 1 Day Post-Op Procedure(s) (LRB): C5-6, C6-7 Anterior Cervical Discectomy and Fusion, Allograft, Plate (N/A) Plan: discharge home. Office 2 wks norco and phenergan Rx  Eldred MangesMark C Shermon Bozzi 09/25/2016, 10:41 AM

## 2016-09-25 NOTE — Discharge Instructions (Signed)
Softer foods to help with swallowing . Use extra collar for showers wrapped in saran wrap. Dry off and reapply dry soft collar. Keep collar on at all times except changing to shower collar. See Dr. Ophelia CharterYates in 2 wks.

## 2016-09-25 NOTE — Progress Notes (Signed)
Orthopedic Tech Progress Note Patient Details:  Miranda Proctor 1960/12/24 [id number]  Ortho Devices Type of Ortho Device: Soft collar Ortho Device/Splint Location: neck Ortho Device/Splint Interventions: Freeman CaldronOrdered   [name] 09/25/2016, 11:12 AM As ordered by Dr. Ophelia CharterYates

## 2016-09-27 ENCOUNTER — Encounter (HOSPITAL_COMMUNITY): Payer: Self-pay | Admitting: Orthopaedic Surgery

## 2016-09-29 ENCOUNTER — Telehealth (INDEPENDENT_AMBULATORY_CARE_PROVIDER_SITE_OTHER): Payer: Self-pay | Admitting: Orthopaedic Surgery

## 2016-09-29 NOTE — Telephone Encounter (Signed)
I called patient and advised that she can change band-aid and put new one on, or put gauze on. Did advise to leave steri strips alone until first post op visit in office.

## 2016-09-29 NOTE — Telephone Encounter (Signed)
Patient wants to know if she can change the dressing on her surgical site. The bandaid on it is coming off. Cb#: [phone]

## 2016-10-03 NOTE — Discharge Summary (Signed)
Patient ID: Miranda Proctor MRN: [id number] DOB/AGE: 1960/08/20 56 y.o.  Admit date: 09/24/2016 Discharge date: 10/03/2016  Admission Diagnoses:  Active Problems:   Cervical spinal stenosis   Discharge Diagnoses:  Active Problems:   Cervical spinal stenosis  status post Procedure(s): C5-6, C6-7 Anterior Cervical Discectomy and Fusion, Allograft, Plate  Past Medical History:  Diagnosis Date  . Acid reflux   . Arthritis   . Cervical stenosis of spine    cervical 5-7  . Esophageal ulcer   . Fibromyalgia   . History of bladder problems   . Hypothyroidism   . IBS (irritable bowel syndrome)   . Migraines   . Osteoarthritis   . Osteoporosis     Surgeries: Procedure(s): C5-6, C6-7 Anterior Cervical Discectomy and Fusion, Allograft, Plate on 9/5/63873/08/2016   Consultants:   Discharged Condition: Improved  Hospital Course: Miranda Sellerdith R Westerlund is an 56 y.o. female who was admitted 09/24/2016 for operative treatment of cervical HNP/stenosis. Patient failed conservative treatments (please see the history and physical for the specifics) and had severe unremitting pain that affects sleep, daily activities and work/hobbies. After pre-op clearance, the patient was taken to the operating room on 09/24/2016 and underwent  Procedure(s): C5-6, C6-7 Anterior Cervical Discectomy and Fusion, Allograft, Plate.    Patient was given perioperative antibiotics:  Anti-infectives    Start     Dose/Rate Route Frequency Ordered Stop   09/24/16 0622  vancomycin (VANCOCIN) IVPB 1000 mg/200 mL premix     1,000 mg 200 mL/hr over 60 Minutes Intravenous On call to O.R. 09/24/16 [id number] 09/24/16 0827       Patient was given sequential compression devices and early ambulation to prevent DVT.   Patient benefited maximally from hospital stay and there were no complications. At the time of discharge, the patient was urinating/moving their bowels without difficulty, tolerating a regular diet, pain is controlled with  oral pain medications and they have been cleared by PT/OT.   Recent vital signs: No data found.    Recent laboratory studies: No results for input(s): WBC, HGB, HCT, PLT, NA, K, CL, CO2, BUN, CREATININE, GLUCOSE, INR, CALCIUM in the last 72 hours.  Invalid input(s): PT, 2   Discharge Medications:   Allergies as of 09/25/2016      Reactions   Penicillins Anaphylaxis   Has patient had a PCN reaction causing immediate rash, facial/tongue/throat swelling, SOB or lightheadedness with hypotension:Yes Has patient had a PCN reaction causing severe rash involving mucus membranes or skin necrosis:Yes Has patient had a PCN reaction that required hospitalization:unsure Has patient had a PCN reaction occurring within the last 10 years:No If all of the above answers are "NO", then may proceed with Cephalosporin use. Childhood reaction.   Codeine Nausea And Vomiting   Severe with dehyration   Levofloxacin Swelling, Rash   SWELLING REACTION UNSPECIFIED    Oxycodone-acetaminophen    UNSPECIFIED REACTION    Sulfamethoxazole-trimethoprim Nausea And Vomiting   Tramadol-acetaminophen Nausea And Vomiting      Medication List    TAKE these medications   celecoxib 200 MG capsule Commonly known as:  CELEBREX Take 200 mg by mouth 2 (two) times daily.   cycloSPORINE 0.05 % ophthalmic emulsion Commonly known as:  RESTASIS Place 1 drop into both eyes 2 (two) times daily as needed (for dry/irritated eyes.).   diclofenac sodium 1 % Gel Commonly known as:  VOLTAREN Apply 1-2 g topically 4 (four) times daily as needed (for pain.).   FLUoxetine 20 MG capsule  Commonly known as:  PROZAC Take 20 mg by mouth daily at 2 PM.   gabapentin 300 MG capsule Commonly known as:  NEURONTIN Take 300 mg by mouth daily at 8 pm.   HYDROcodone-acetaminophen 5-325 MG tablet Commonly known as:  NORCO Take 1 tablet by mouth every 6 (six) hours as needed for moderate pain.   levothyroxine 88 MCG tablet Commonly known  as:  SYNTHROID, LEVOTHROID TAKE 1 TABLET BY MOUTH EVERY DAY AT 7 AM   multivitamin with minerals Tabs tablet Take 1 tablet by mouth daily at 2 PM.   NEXIUM 40 MG capsule Generic drug:  esomeprazole Take 40 mg by mouth daily at 12 noon.   promethazine 50 MG tablet Commonly known as:  PHENERGAN Take 0.5 tablets (25 mg total) by mouth every 6 (six) hours as needed for nausea or vomiting.   tolterodine 4 MG 24 hr capsule Commonly known as:  DETROL LA Take 4 mg by mouth daily at 2 PM.   Vitamin D3 2000 units Tabs Take 2,000 Units by mouth daily at 2 PM.       Diagnostic Studies: Dg Cervical Spine 2-3 Views  Result Date: 09/24/2016 CLINICAL DATA:  Anterior cervical fusion EXAM: CERVICAL SPINE - 2-3 VIEW; DG C-ARM 61-120 MIN COMPARISON:  None. FLUOROSCOPY TIME:  Radiation Exposure Index (as provided by the fluoroscopic device): Not available If the device does not provide the exposure index: Fluoroscopy Time:  15 seconds Number of Acquired Images:  4 FINDINGS: Initial images demonstrate evidence of surgical instrument at the anterior aspect of the C5-6 level. Follow-up film show evidence of fusion at C5-6 and C6-7. No acute abnormality noted. IMPRESSION: Cervical fusion at C5-6 and C6-7. Electronically Signed   By: Alcide Clever M.D.   On: 09/24/2016 09:56   Dg C-arm 1-60 Min  Result Date: 09/24/2016 CLINICAL DATA:  Anterior cervical fusion EXAM: CERVICAL SPINE - 2-3 VIEW; DG C-ARM 61-120 MIN COMPARISON:  None. FLUOROSCOPY TIME:  Radiation Exposure Index (as provided by the fluoroscopic device): Not available If the device does not provide the exposure index: Fluoroscopy Time:  15 seconds Number of Acquired Images:  4 FINDINGS: Initial images demonstrate evidence of surgical instrument at the anterior aspect of the C5-6 level. Follow-up film show evidence of fusion at C5-6 and C6-7. No acute abnormality noted. IMPRESSION: Cervical fusion at C5-6 and C6-7. Electronically Signed   By: Alcide Clever  M.D.   On: 09/24/2016 09:56        Discharge Plan:  discharge to home  Disposition:     Signed: Zonia Kief 10/03/2016, 11:19 PM

## 2016-10-08 ENCOUNTER — Encounter (INDEPENDENT_AMBULATORY_CARE_PROVIDER_SITE_OTHER): Payer: Self-pay | Admitting: Orthopaedic Surgery

## 2016-10-08 ENCOUNTER — Ambulatory Visit (INDEPENDENT_AMBULATORY_CARE_PROVIDER_SITE_OTHER): Payer: 59

## 2016-10-08 ENCOUNTER — Ambulatory Visit (INDEPENDENT_AMBULATORY_CARE_PROVIDER_SITE_OTHER): Payer: Self-pay | Admitting: Orthopaedic Surgery

## 2016-10-08 VITALS — BP 146/99 | HR 102 | Ht 66.0 in | Wt 175.0 lb

## 2016-10-08 DIAGNOSIS — Z981 Arthrodesis status: Secondary | ICD-10-CM

## 2016-10-08 DIAGNOSIS — Z09 Encounter for follow-up examination after completed treatment for conditions other than malignant neoplasm: Secondary | ICD-10-CM

## 2016-10-08 NOTE — Progress Notes (Signed)
   Post-Op Visit Note   Patient: Miranda Proctor           Date of Birth: [birth year]           MRN: [id number] Visit Date: 10/08/2016 PCP: Primus BravoGIBSON,TIFFANY, NP   Assessment & Plan:  Chief Complaint:  Chief Complaint  Patient presents with  . Neck - Follow-up   Visit Diagnoses:  1. Postop check   2. Status post cervical spinal fusion     Plan: Return visit 4 weeks for flexion extension lateral C-spine x-ray out of collar. She's had some dysphonia her voice tires at the end of the day some dysphasia as expected with two-level procedure. She had considerable problems with postoperative nausea and vomiting in the first 24 hours.  Follow-Up Instructions: Return in about 4 weeks (around 11/05/2016).   Orders:  Orders Placed This Encounter  Procedures  . XR Cervical Spine 2 or 3 views   No orders of the defined types were placed in this encounter.  HPI Patient here for 2 week post op check. C5-6, C6-7 Anterior Cervical Discectomy and Fusion, Allograft, Plate performed on 09/24/16. Patient reports having recent radicular arm pain mostly left sided.  Imaging: No results found.  PMFS History: Patient Active Problem List   Diagnosis Date Noted  . Cervical spinal stenosis 09/24/2016  . Other spondylosis with radiculopathy, cervical region 08/23/2016   Past Medical History:  Diagnosis Date  . Acid reflux   . Arthritis   . Cervical stenosis of spine    cervical 5-7  . Esophageal ulcer   . Fibromyalgia   . History of bladder problems   . Hypothyroidism   . IBS (irritable bowel syndrome)   . Migraines   . Osteoarthritis   . Osteoporosis     Family History  Problem Relation Age of Onset  . Cancer Mother   . Heart disease Father   . Stroke Father     Past Surgical History:  Procedure Laterality Date  . ABDOMINAL HYSTERECTOMY    . ANTERIOR CERVICAL DECOMP/DISCECTOMY FUSION N/A 09/24/2016   Procedure: C5-6, C6-7 Anterior Cervical Discectomy and Fusion, Allograft, Plate;   Surgeon: Eldred MangesMark C Ringo Sherod, MD;  Location: [facility] OR;  Service: Orthopedics;  Laterality: N/A;  . APPENDECTOMY    . CARPAL TUNNEL RELEASE     B/L  . COLONOSCOPY W/ BIOPSIES AND POLYPECTOMY    . cubital release Right   . LAPAROSCOPIC CHOLECYSTECTOMY    . SHOULDER SURGERY Left   . SHOULDER SURGERY Right   . SHOULDER SURGERY Right    Social History   Occupational History  . Not on file.   Social History Main Topics  . Smoking status: Current Every Day Smoker    Packs/day: 0.50    Types: Cigarettes  . Smokeless tobacco: Never Used  . Alcohol use No  . Drug use: No  . Sexual activity: Not on file

## 2016-11-09 ENCOUNTER — Ambulatory Visit (INDEPENDENT_AMBULATORY_CARE_PROVIDER_SITE_OTHER): Payer: 59

## 2016-11-09 ENCOUNTER — Encounter (INDEPENDENT_AMBULATORY_CARE_PROVIDER_SITE_OTHER): Payer: Self-pay | Admitting: Orthopaedic Surgery

## 2016-11-09 ENCOUNTER — Ambulatory Visit (INDEPENDENT_AMBULATORY_CARE_PROVIDER_SITE_OTHER): Payer: Self-pay | Admitting: Orthopaedic Surgery

## 2016-11-09 VITALS — BP 118/86 | HR 73 | Ht 66.0 in | Wt 176.0 lb

## 2016-11-09 DIAGNOSIS — M4802 Spinal stenosis, cervical region: Secondary | ICD-10-CM

## 2016-11-09 DIAGNOSIS — M545 Low back pain, unspecified: Secondary | ICD-10-CM | POA: Insufficient documentation

## 2016-11-09 DIAGNOSIS — G8929 Other chronic pain: Secondary | ICD-10-CM

## 2016-11-09 NOTE — Addendum Note (Signed)
Addended by: Rogers Seeds on: 11/09/2016 03:09 PM   Modules accepted: Orders

## 2016-11-09 NOTE — Progress Notes (Signed)
Post-Op Visit Note   Patient: Miranda Proctor           Date of Birth: 1961-01-28           MRN: [id number] Visit Date: 11/09/2016 PCP: Primus Bravo, NP   Assessment & Plan: Patient can discontinue the collar cervical fusion is solid. She still has some soreness in her throat. Postop time. She was treated for some thrush and has finished up antibiotics for this as well as the mouthwash. This is something that she's had with other anesthetic Procedures in the past. We'll proceed with lumbar MRI and office follow-up after scan. She's been through anti-inflammatories extensive physical therapy 14 weeks 3 times a week in the past without improvement in her back symptoms. She has back pain more right than left leg pain pain with ambulation and activities.x  Chief Complaint:  Chief Complaint  Patient presents with  . Neck - Routine Post Op   Visit Diagnoses:  1. Cervical spinal stenosis   2. Chronic right-sided low back pain, with sciatica presence unspecified     Plan: Patient's had ongoing low back pain which she has been going on for more than 6 months. She went through physical therapy extensively related to a fall when she was at work and this is now had the case closed he has persistent back and buttocks pain. She's not had an epidural injection. She would like to proceed with lumbar MRI imaging to evaluate her back for any areas of potential compression. She has back pain buttocks pain worse on the right than left pain radiates down into her posterior thigh. It bothers her at night at times about this or with firm chairs she denies any associated bowel or bladder symptoms.  Follow-Up Instructions: No Follow-up on file.   Orders:  Orders Placed This Encounter  Procedures  . XR Cervical Spine 2 or 3 views   No orders of the defined types were placed in this encounter.   Imaging: Xr Cervical Spine 2 Or 3 Views  Result Date: 11/09/2016 Flexion-extension lateral C-spine x-rays  demonstrate good incorporation to level C5-6 C6-7 cervical fusion. She is consolidated her grafts and has no motion on flexion-extension. Impression: Satisfactory postop two-level cervical fusion with no motion on flexion-extension views.   PMFS History: Patient Active Problem List   Diagnosis Date Noted  . Chronic right-sided low back pain 11/09/2016  . Cervical spinal stenosis 09/24/2016  . Other spondylosis with radiculopathy, cervical region 08/23/2016   Past Medical History:  Diagnosis Date  . Acid reflux   . Arthritis   . Cervical stenosis of spine    cervical 5-7  . Esophageal ulcer   . Fibromyalgia   . History of bladder problems   . Hypothyroidism   . IBS (irritable bowel syndrome)   . Migraines   . Osteoarthritis   . Osteoporosis     Family History  Problem Relation Age of Onset  . Cancer Mother   . Heart disease Father   . Stroke Father     Past Surgical History:  Procedure Laterality Date  . ABDOMINAL HYSTERECTOMY    . ANTERIOR CERVICAL DECOMP/DISCECTOMY FUSION N/A 09/24/2016   Procedure: C5-6, C6-7 Anterior Cervical Discectomy and Fusion, Allograft, Plate;  Surgeon: Eldred Manges, MD;  Location: [facility] OR;  Service: Orthopedics;  Laterality: N/A;  . APPENDECTOMY    . CARPAL TUNNEL RELEASE     B/L  . COLONOSCOPY W/ BIOPSIES AND POLYPECTOMY    . cubital release Right   .  LAPAROSCOPIC CHOLECYSTECTOMY    . SHOULDER SURGERY Left   . SHOULDER SURGERY Right   . SHOULDER SURGERY Right    Social History   Occupational History  . Not on file.   Social History Main Topics  . Smoking status: Current Every Day Smoker    Packs/day: 0.50    Types: Cigarettes  . Smokeless tobacco: Never Used  . Alcohol use No  . Drug use: No  . Sexual activity: Not on file

## 2016-11-16 ENCOUNTER — Other Ambulatory Visit (INDEPENDENT_AMBULATORY_CARE_PROVIDER_SITE_OTHER): Payer: Self-pay | Admitting: Orthopaedic Surgery

## 2016-11-22 ENCOUNTER — Ambulatory Visit
Admission: RE | Admit: 2016-11-22 | Discharge: 2016-11-22 | Disposition: A | Discharge status: Home / Self Care | Payer: 59 | Source: Ambulatory Visit | Attending: Orthopaedic Surgery | Admitting: Orthopaedic Surgery

## 2016-11-22 DIAGNOSIS — G8929 Other chronic pain: Secondary | ICD-10-CM

## 2016-11-22 DIAGNOSIS — M545 Low back pain: Principal | ICD-10-CM

## 2016-12-22 ENCOUNTER — Encounter (INDEPENDENT_AMBULATORY_CARE_PROVIDER_SITE_OTHER): Payer: Self-pay | Admitting: Orthopaedic Surgery

## 2016-12-22 ENCOUNTER — Ambulatory Visit (INDEPENDENT_AMBULATORY_CARE_PROVIDER_SITE_OTHER): Payer: 59

## 2016-12-22 ENCOUNTER — Ambulatory Visit (INDEPENDENT_AMBULATORY_CARE_PROVIDER_SITE_OTHER): Payer: Self-pay | Admitting: Orthopaedic Surgery

## 2016-12-22 VITALS — BP 135/91 | HR 72 | Ht 66.0 in | Wt 176.0 lb

## 2016-12-22 DIAGNOSIS — M542 Cervicalgia: Secondary | ICD-10-CM

## 2016-12-22 NOTE — Progress Notes (Addendum)
Office Visit Note   Patient: Miranda Proctor           Date of Birth: 09/10/1960           MRN: [id number] Visit Date: 12/22/2016              Requested by: Primus Bravo, NP [address] [place], [place] [address] PCP: Primus Bravo, NP   Assessment & Plan: Visit Diagnoses:  1. Neck pain   2.  Low back pain with some retrolisthesis on lumbar MRI  Plan: She can return for recheck in 2 months.  Follow-Up Instructions: Follow-up 2 months. Single spot lateral x-ray C-spine on return.  Orders:  Orders Placed This Encounter  Procedures  . XR Cervical Spine 2 or 3 views   No orders of the defined types were placed in this encounter.     Procedures: No procedures performed   Clinical Data: No additional findings.   Subjective: Chief Complaint  Patient presents with  . Neck - Pain  . Lower Back - Pain    HPI patient 12  weeks post 2 level cervical fusion. Relief of arm pain and neck pain but she states she still has the pain that she had between her shoulder blades persist.. There was changes are lumbar MRI suggestive of hemangioma. I discussed on the phone with Dr. Laurie Panda options and the we can consider possibly repeating the MRI in 3-6 months. Her plain radiographs are normal. She's happy with surgical results for neck fusion.  Review of Systems systems updated and negative except for history of present illness   Objective: Vital Signs: BP (!) 135/91   Pulse 72   Ht 5\' 6"  (1.676 m)   Wt 176 lb (79.8 kg)   BMI 28.41 kg/m   Physical Exam  Constitutional: She is oriented to person, place, and time. She appears well-developed.  HENT:  Head: Normocephalic.  Right Ear: External ear normal.  Left Ear: External ear normal.  Eyes: Pupils are equal, round, and reactive to light.  Neck: No tracheal deviation present. No thyromegaly present.  Cardiovascular: Normal rate.   Pulmonary/Chest: Effort normal.  Abdominal: Soft.  Musculoskeletal:  Bone healed  cervical incision no brachial plexus tenderness. Reflexes are intact, normal gait.  Neurological: She is alert and oriented to person, place, and time.  Skin: Skin is warm and dry.  Psychiatric: She has a normal mood and affect. Her behavior is normal.    Ortho Exam  Specialty Comments:  No specialty comments available.  Imaging: X-ray cervical spine shows good position plate screws graft and incorporation of the graft with no motion on flexion-extension x-rays   PMFS History: Patient Active Problem List   Diagnosis Date Noted  . Chronic right-sided low back pain 11/09/2016  . Cervical spinal stenosis 09/24/2016  . Other spondylosis with radiculopathy, cervical region 08/23/2016   Past Medical History:  Diagnosis Date  . Acid reflux   . Arthritis   . Cervical stenosis of spine    cervical 5-7  . Esophageal ulcer   . Fibromyalgia   . History of bladder problems   . Hypothyroidism   . IBS (irritable bowel syndrome)   . Migraines   . Osteoarthritis   . Osteoporosis     Family History  Problem Relation Age of Onset  . Cancer Mother   . Heart disease Father   . Stroke Father     Past Surgical History:  Procedure Laterality Date  . ABDOMINAL HYSTERECTOMY    .  ANTERIOR CERVICAL DECOMP/DISCECTOMY FUSION N/A 09/24/2016   Procedure: C5-6, C6-7 Anterior Cervical Discectomy and Fusion, Allograft, Plate;  Surgeon: Eldred MangesMark C Yates, MD;  Location: [facility] OR;  Service: Orthopedics;  Laterality: N/A;  . APPENDECTOMY    . CARPAL TUNNEL RELEASE     B/L  . COLONOSCOPY W/ BIOPSIES AND POLYPECTOMY    . cubital release Right   . LAPAROSCOPIC CHOLECYSTECTOMY    . SHOULDER SURGERY Left   . SHOULDER SURGERY Right   . SHOULDER SURGERY Right    Social History   Occupational History  . Not on file.   Social History Main Topics  . Smoking status: Current Every Day Smoker    Packs/day: 0.50    Types: Cigarettes  . Smokeless tobacco: Never Used  . Alcohol use No  . Drug use: No  . Sexual  activity: Not on file

## 2017-02-22 ENCOUNTER — Ambulatory Visit (INDEPENDENT_AMBULATORY_CARE_PROVIDER_SITE_OTHER): Payer: 59 | Admitting: Orthopaedic Surgery

## 2017-03-02 ENCOUNTER — Ambulatory Visit (INDEPENDENT_AMBULATORY_CARE_PROVIDER_SITE_OTHER): Payer: 59

## 2017-03-02 ENCOUNTER — Ambulatory Visit (INDEPENDENT_AMBULATORY_CARE_PROVIDER_SITE_OTHER): Payer: 59 | Admitting: Orthopaedic Surgery

## 2017-03-02 DIAGNOSIS — M542 Cervicalgia: Secondary | ICD-10-CM | POA: Diagnosis not present

## 2017-03-02 NOTE — Progress Notes (Signed)
Office Visit Note   Patient: Miranda Proctor           Date of Birth: Mar 27, 1961           MRN: [id number] Visit Date: 03/02/2017              Requested by: Primus BravoGibson, Tiffany, NP [address] [place], [place] [address] PCP: Primus BravoGibson, Tiffany, NP   Assessment & Plan: Visit Diagnoses:  1. Cervicalgia     Plan: X-ray shows solid fusion C5-6 C6-7. I can check her back again on a when necessary basis.  Follow-Up Instructions: No Follow-up on file.   Orders:  Orders Placed This Encounter  Procedures  . XR Cervical Spine 2 or 3 views   No orders of the defined types were placed in this encounter.     Procedures: No procedures performed   Clinical Data: No additional findings.   Subjective: No chief complaint on file.   HPI patient returns post March 2018 two-level cervical fusion C5-6 C6-7. Patient states he still having burning between the 2 superior medial border of the scapula slightly worse on the right than left. Patient previously drove a tractor trailer . Patient states her husband still drives but she does not go with him. Originally patient fell when she was getting into the truck and she states that she passed out at the time when she fell landing on her back.  Review of Systems 14 point review of systems is updated and is unchanged. Of note is previous perform nerve surgery. Foot injury, cervical fusion.   Objective: Vital Signs: There were no vitals taken for this visit.  Physical Exam  Constitutional: She is oriented to person, place, and time. She appears well-developed.  HENT:  Head: Normocephalic.  Right Ear: External ear normal.  Left Ear: External ear normal.  Eyes: Pupils are equal, round, and reactive to light.  Neck: No tracheal deviation present. No thyromegaly present.  Cardiovascular: Normal rate.   Pulmonary/Chest: Effort normal.  Abdominal: Soft.  Musculoskeletal:  Patient has well-healed anterior cervical incision negative impingement  shoulders. Reflexes are 1+ and symmetrical biceps triceps brachioradialis.  Neurological: She is alert and oriented to person, place, and time.  Skin: Skin is warm and dry.  Psychiatric: She has a normal mood and affect. Her behavior is normal.    Ortho Exam well-healed the right trigger thumb release. Healed incision from right cubital tunnel surgery. Bilateral carpal tunnel incisions well-healed. Tendon CMC reconstruction left hand using a palmaris longus harvest.  Specialty Comments:  No specialty comments available.  Imaging: No results found.   PMFS History: Patient Active Problem List   Diagnosis Date Noted  . Chronic right-sided low back pain 11/09/2016  . Cervical spinal stenosis 09/24/2016  . Other spondylosis with radiculopathy, cervical region 08/23/2016   Past Medical History:  Diagnosis Date  . Acid reflux   . Arthritis   . Cervical stenosis of spine    cervical 5-7  . Esophageal ulcer   . Fibromyalgia   . History of bladder problems   . Hypothyroidism   . IBS (irritable bowel syndrome)   . Migraines   . Osteoarthritis   . Osteoporosis     Family History  Problem Relation Age of Onset  . Cancer Mother   . Heart disease Father   . Stroke Father     Past Surgical History:  Procedure Laterality Date  . ABDOMINAL HYSTERECTOMY    . ANTERIOR CERVICAL DECOMP/DISCECTOMY FUSION N/A 09/24/2016  Procedure: C5-6, C6-7 Anterior Cervical Discectomy and Fusion, Allograft, Plate;  Surgeon: Eldred Manges, MD;  Location: [facility] OR;  Service: Orthopedics;  Laterality: N/A;  . APPENDECTOMY    . CARPAL TUNNEL RELEASE     B/L  . COLONOSCOPY W/ BIOPSIES AND POLYPECTOMY    . cubital release Right   . LAPAROSCOPIC CHOLECYSTECTOMY    . SHOULDER SURGERY Left   . SHOULDER SURGERY Right   . SHOULDER SURGERY Right    Social History   Occupational History  . Not on file.   Social History Main Topics  . Smoking status: Current Every Day Smoker    Packs/day: 0.50    Types:  Cigarettes  . Smokeless tobacco: Never Used  . Alcohol use No  . Drug use: No  . Sexual activity: Not on file

## 2018-06-06 ENCOUNTER — Ambulatory Visit (INDEPENDENT_AMBULATORY_CARE_PROVIDER_SITE_OTHER): Payer: BLUE CROSS/BLUE SHIELD | Admitting: Orthopaedic Surgery

## 2018-06-06 ENCOUNTER — Encounter (INDEPENDENT_AMBULATORY_CARE_PROVIDER_SITE_OTHER): Payer: Self-pay | Admitting: Orthopaedic Surgery

## 2018-06-06 ENCOUNTER — Ambulatory Visit (INDEPENDENT_AMBULATORY_CARE_PROVIDER_SITE_OTHER): Payer: Self-pay

## 2018-06-06 VITALS — BP 122/83 | HR 83 | Ht 66.0 in | Wt 180.0 lb

## 2018-06-06 DIAGNOSIS — M542 Cervicalgia: Secondary | ICD-10-CM

## 2018-06-06 NOTE — Addendum Note (Signed)
Addended by: Rogers Seeds on: 06/06/2018 02:42 PM   Modules accepted: Orders

## 2018-06-06 NOTE — Progress Notes (Signed)
Office Visit Note   Patient: Miranda Proctor           Date of Birth: [birth year]           MRN: [id number] Visit Date: 06/06/2018              Requested by: Primus Bravo, NP [address] [place], [place] [address] PCP: Primus Bravo, NP   Assessment & Plan: Visit Diagnoses:  1. Neck pain     Plan: Persistent neck pain post 2 level cervical fusion.  Preoperative MRI was reviewed with her she basically had 2 level disc disease with some narrowing at C3-4.  X-rays today show further progression at C3-4 narrowing.  C5-6 level looks completely fused and there is a small partial lucent line at C6-7 suggesting it may have incomplete healing.  Due to her significant pain with associated headaches pain that she rates as severe would recommend repeat MRI scan cervical spine.  Office follow-up after scan for review.  Follow-Up Instructions: No follow-ups on file.   Orders:  Orders Placed This Encounter  Procedures  . XR Cervical Spine 2 or 3 views   No orders of the defined types were placed in this encounter.     Procedures: No procedures performed   Clinical Data: No additional findings.   Subjective: Chief Complaint  Patient presents with  . Neck - Pain    09/24/16 C5-6, C6-7 ACDF, Allograft, Plate    HPI 57 year old female returns with ongoing problems with neck pain posterior headaches pain that radiates to the medial border of both shoulder blades.  She is had pain down in her hand had recent trigger thumb release and then had to have repeat surgery for some scar tissue.  She states occasionally she is felt electrical shocks shoot down her back and down into her legs and she has been ambulating with cane.  Originally she fell backwards from a truck and cervical fusion was 09/24/2016.  She states she is had persistent pain in her neck and inability to turn to the left due to sharp pain which she can turn to the right.  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  She denies any lower extremity myelopathic symptoms.  Review of Systems 14 point update unchanged from last her surgery date, other than as mentioned in HPI.   Objective: Vital Signs: BP 122/83   Pulse 83   Ht 5\' 6"  (1.676 m)   Wt 180 lb (81.6 kg)   BMI 29.05 kg/m   Physical Exam  Constitutional: She is oriented to person, place, and time. She appears well-developed.  HENT:  Head: Normocephalic.  Right Ear: External ear normal.  Left Ear: External ear normal.  Eyes: Pupils are equal, round, and reactive to light.  Neck: No tracheal deviation present. No thyromegaly present.  Cardiovascular: Normal rate.  Pulmonary/Chest: Effort normal.  Abdominal: Soft.  Neurological: She is alert and oriented to person, place, and time.  Skin: Skin is warm and dry.  Psychiatric: She has a normal mood and affect. Her behavior is normal.    Ortho Exam no triggering right thumb well-healed trigger thumb transverse incision.  Well-healed cervical incision she has brachial plexus tenderness more the left than right trapezial tenderness that causes her to withdraw and cringe.  She has 10 degrees rotation of the left 40 to the right cervical spine.  Increased pain with compression cervical spine.  Upper extremity reflexes biceps triceps brachioradialis  are 2+ and symmetrical.  Specialty Comments:  No specialty comments available.  Imaging: No results found.   PMFS History: Patient Active Problem List   Diagnosis Date Noted  . Chronic right-sided low back pain 11/09/2016  . Cervical spinal stenosis 09/24/2016  . Other spondylosis with radiculopathy, cervical region 08/23/2016   Past Medical History:  Diagnosis Date  . Acid reflux   . Arthritis   . Cervical stenosis of spine    cervical 5-7  . Esophageal ulcer   . Fibromyalgia   . History of bladder problems   . Hypothyroidism   . IBS (irritable bowel syndrome)   . Migraines   . Osteoarthritis   . Osteoporosis       Family History  Problem Relation Age of Onset  . Cancer Mother   . Heart disease Father   . Stroke Father     Past Surgical History:  Procedure Laterality Date  . ABDOMINAL HYSTERECTOMY    . ANTERIOR CERVICAL DECOMP/DISCECTOMY FUSION N/A 09/24/2016   Procedure: C5-6, C6-7 Anterior Cervical Discectomy and Fusion, Allograft, Plate;  Surgeon: Eldred MangesMark C Thelonious Kauffmann, MD;  Location: [facility] OR;  Service: Orthopedics;  Laterality: N/A;  . APPENDECTOMY    . CARPAL TUNNEL RELEASE     B/L  . COLONOSCOPY W/ BIOPSIES AND POLYPECTOMY    . cubital release Right   . LAPAROSCOPIC CHOLECYSTECTOMY    . SHOULDER SURGERY Left   . SHOULDER SURGERY Right   . SHOULDER SURGERY Right    Social History   Occupational History  . Not on file  Tobacco Use  . Smoking status: Current Every Day Smoker    Packs/day: 0.50    Types: Cigarettes  . Smokeless tobacco: Never Used  Substance and Sexual Activity  . Alcohol use: No  . Drug use: No  . Sexual activity: Not on file

## 2018-06-19 IMAGING — RF DG C-ARM 61-120 MIN
1 series · 4 of 4 positions shown · non-contrast
Comparison: None.

CLINICAL DATA: Anterior cervical fusion

EXAM:
CERVICAL SPINE - 2-3 VIEW; DG C-ARM 61-120 MIN

[Series 1: run · 4 of 4 slices shown]
[im 1/4]
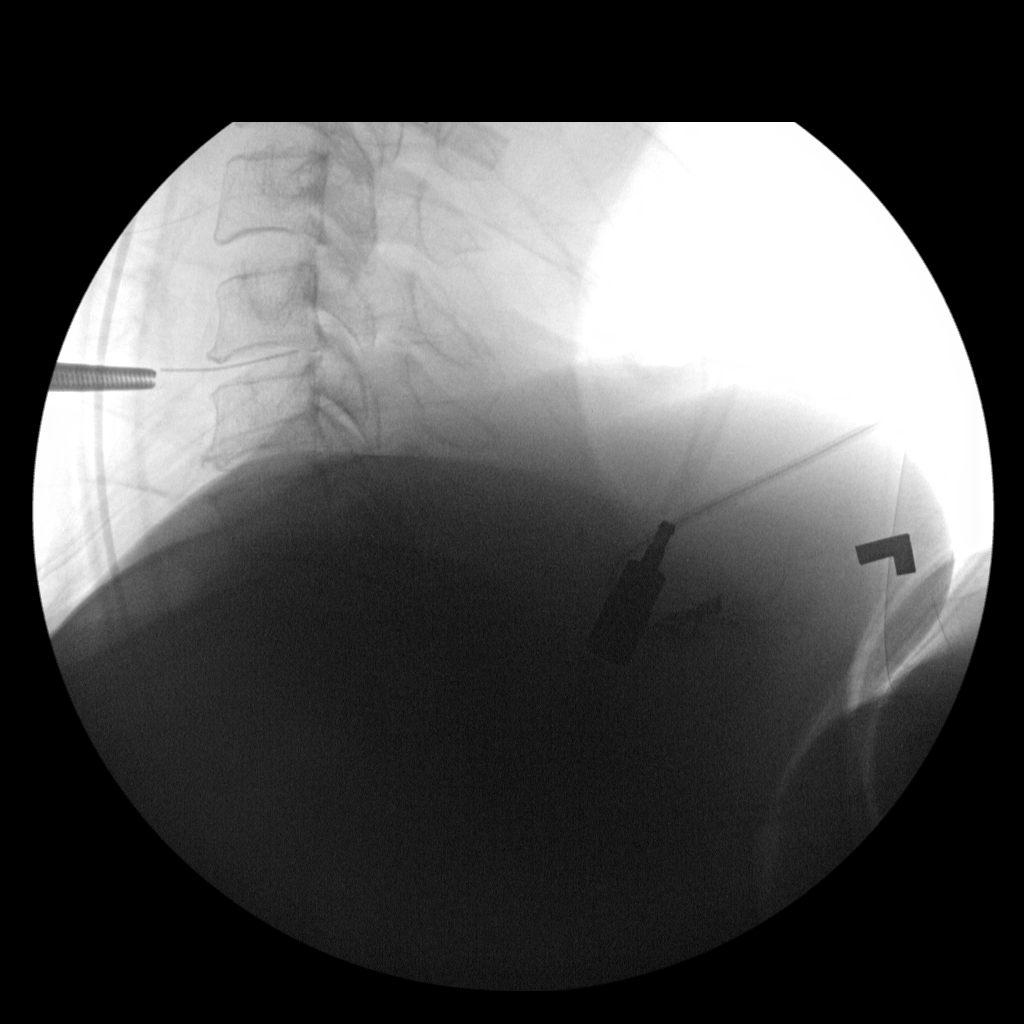
[im 2/4]
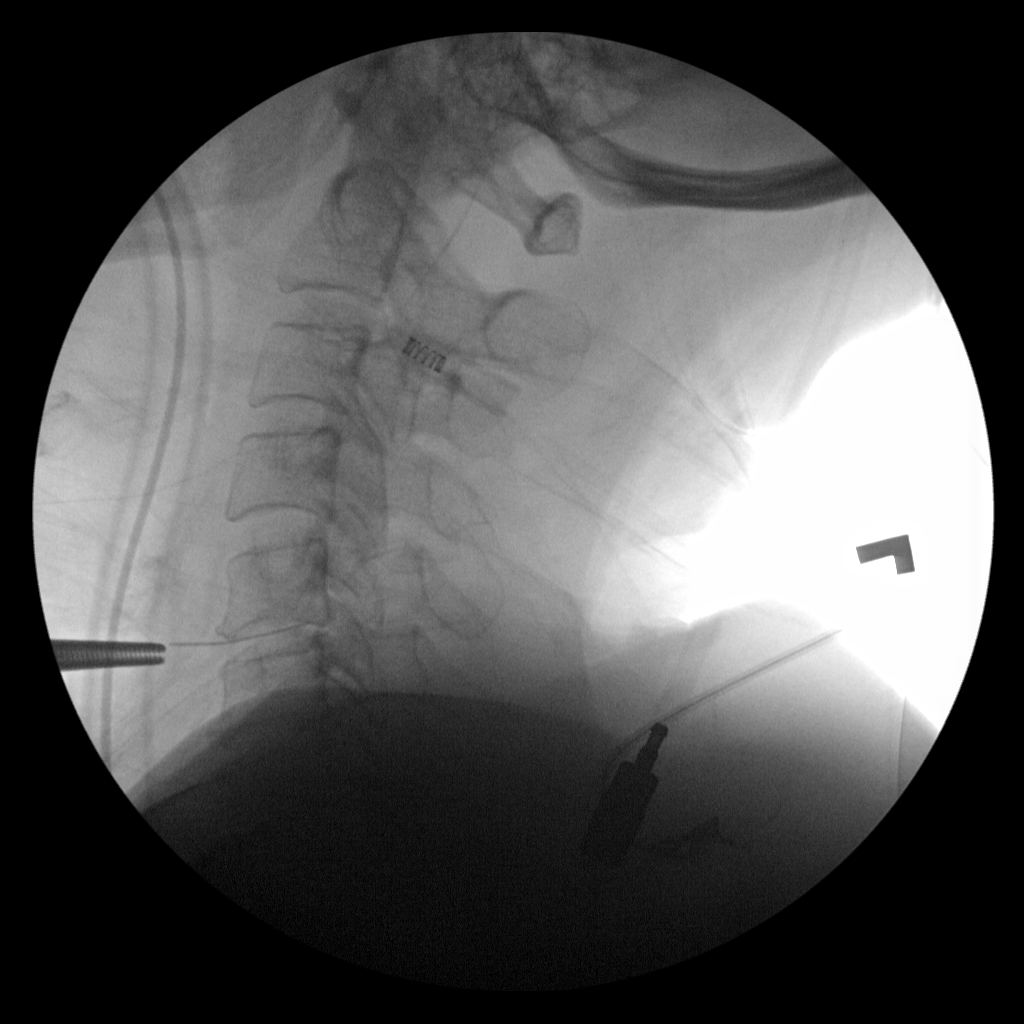
[im 3/4]
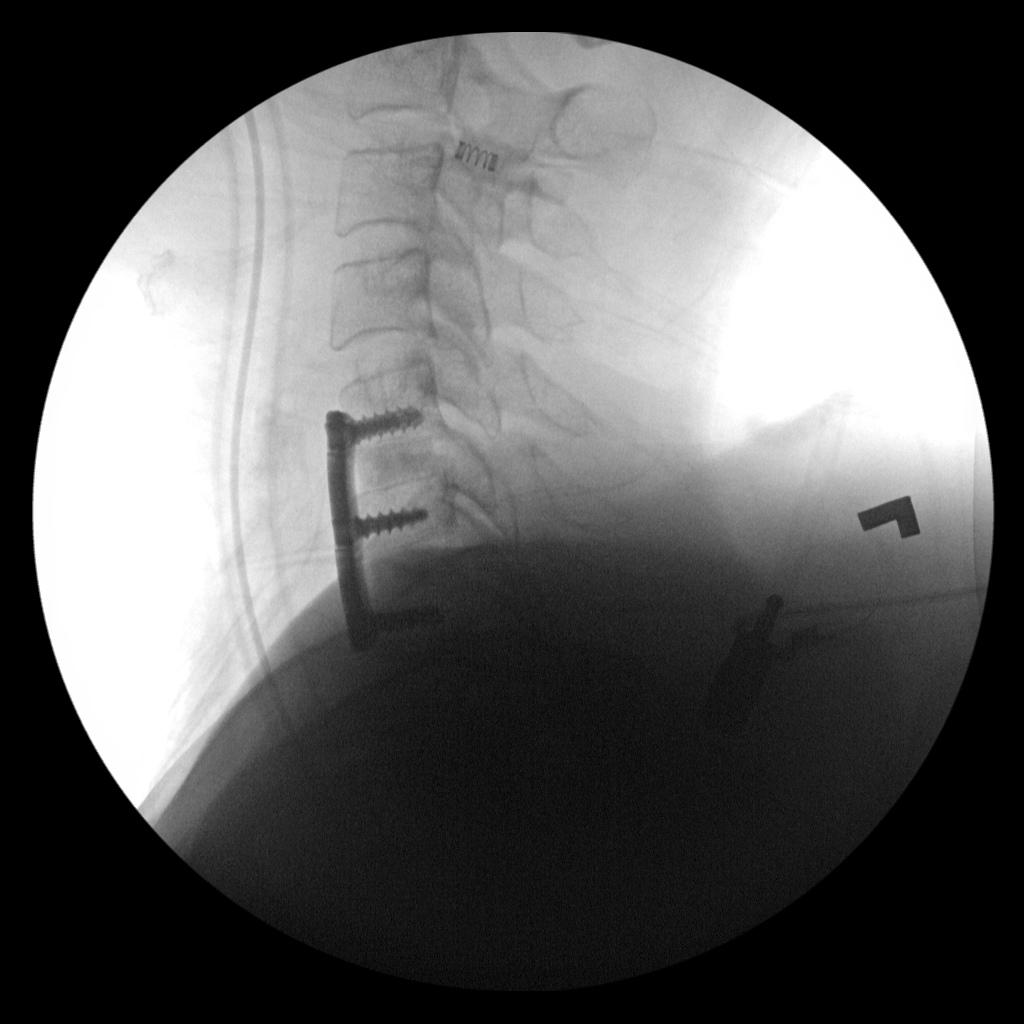
[im 4/4]
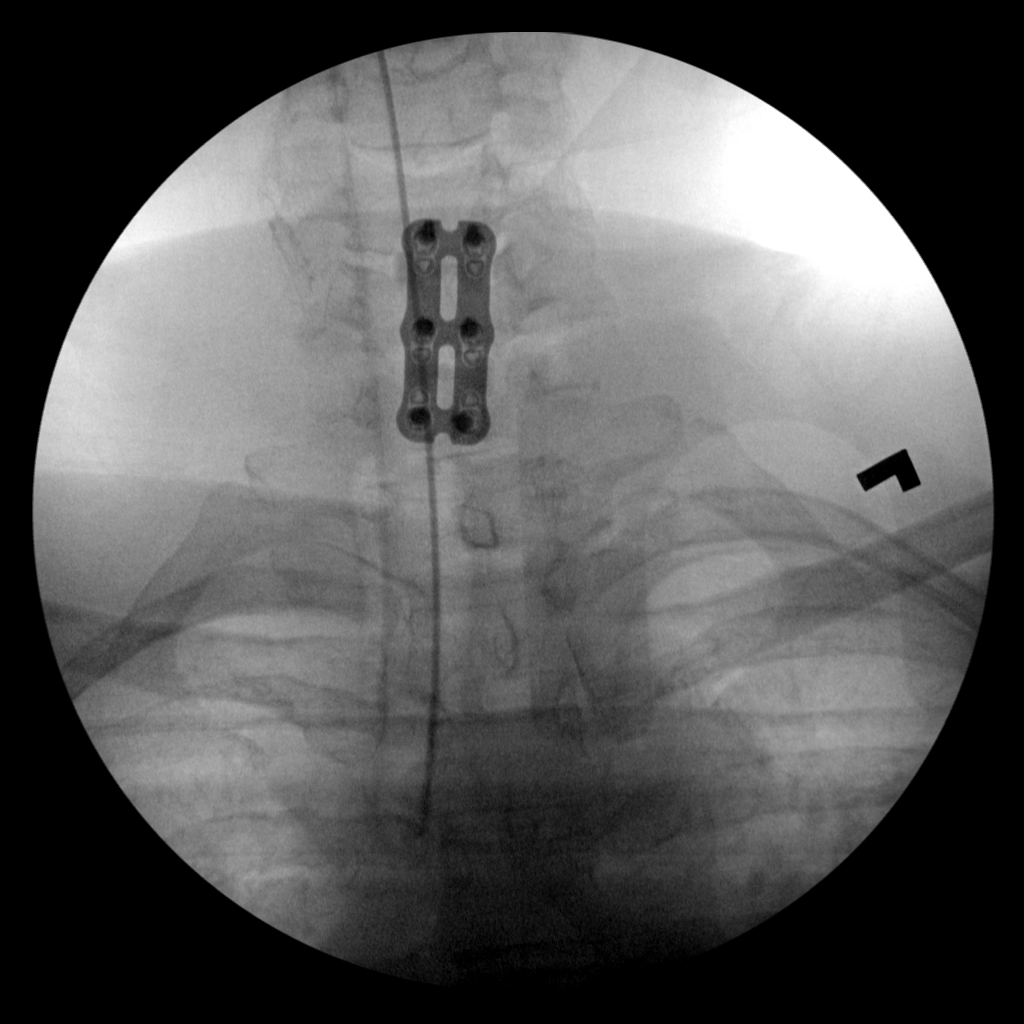

[4 of 4 positions shown; findings below may reference images not displayed]

FLUOROSCOPY TIME:  Radiation Exposure Index (as provided by the
fluoroscopic device): Not available

If the device does not provide the exposure index:

Fluoroscopy Time:  15 seconds

Number of Acquired Images:  4
FINDINGS: Initial images demonstrate evidence of surgical instrument at the
anterior aspect of the C5-6 level. Follow-up film show evidence of
fusion at C5-6 and C6-7. No acute abnormality noted.
IMPRESSION: Cervical fusion at C5-6 and C6-7.

## 2018-06-20 ENCOUNTER — Other Ambulatory Visit (INDEPENDENT_AMBULATORY_CARE_PROVIDER_SITE_OTHER): Payer: Self-pay | Admitting: Orthopaedic Surgery

## 2018-06-21 ENCOUNTER — Other Ambulatory Visit (INDEPENDENT_AMBULATORY_CARE_PROVIDER_SITE_OTHER): Payer: Self-pay | Admitting: Specialist

## 2018-06-21 ENCOUNTER — Other Ambulatory Visit (INDEPENDENT_AMBULATORY_CARE_PROVIDER_SITE_OTHER): Payer: Self-pay | Admitting: Orthopaedic Surgery

## 2018-06-26 ENCOUNTER — Ambulatory Visit
Admission: RE | Admit: 2018-06-26 | Discharge: 2018-06-26 | Disposition: A | Discharge status: Home / Self Care | Payer: 59 | Source: Ambulatory Visit | Attending: Orthopaedic Surgery | Admitting: Orthopaedic Surgery

## 2018-06-26 DIAGNOSIS — M542 Cervicalgia: Secondary | ICD-10-CM

## 2018-07-05 ENCOUNTER — Encounter (INDEPENDENT_AMBULATORY_CARE_PROVIDER_SITE_OTHER): Payer: Self-pay | Admitting: Orthopaedic Surgery

## 2018-07-05 ENCOUNTER — Ambulatory Visit (INDEPENDENT_AMBULATORY_CARE_PROVIDER_SITE_OTHER): Payer: BLUE CROSS/BLUE SHIELD | Admitting: Orthopaedic Surgery

## 2018-07-05 DIAGNOSIS — M542 Cervicalgia: Secondary | ICD-10-CM | POA: Diagnosis not present

## 2018-07-06 NOTE — Progress Notes (Signed)
Office Visit Note   Patient: Miranda Proctor           Date of Birth: 1961/01/03           MRN: [id number] Visit Date: 07/05/2018              Requested by: Primus Bravo, NP [address] [place], [place] [address] PCP: Primus Bravo, NP   Assessment & Plan: Visit Diagnoses:  1. Cervicalgia            With possible pseudoarthrosis C6-7 post cervical fusion March 2018.  Plan: MRI scan is reviewed reviewed with patient images I gave her a copy of the report she has some worsening of the foraminal narrowing at C3-4 with further disc space narrowing collapse and osteophyte formation versus last year's MRI scan.  C5-6 looks well-healed but I still see a lucent line through C6-7 on both plain radiographs and questionable on MRI scan.  She continues to have neck pain that radiates into her shoulders will obtain a cervical spine CT scan to evaluate her for symptomatic pseudoarthrosis at the C6-7 level.  Office follow-up after scan for review.  Follow-Up Instructions: No follow-ups on file.   Orders:  Orders Placed This Encounter  Procedures  . CT CERVICAL SPINE WO CONTRAST   No orders of the defined types were placed in this encounter.     Procedures: No procedures performed   Clinical Data: No additional findings.   Subjective: Chief Complaint  Patient presents with  . Neck - Pain, Follow-up    HPI patient continues to have ongoing neck pain and pain that radiates into her shoulders problems when she rotates to the left no problems with rotating to the right.  Originally she fell off a truck and had cervical fusion for cervical spondylosis in March 2018 at C5-6 and C6-7 level.  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  She denies any lower extremity symptoms pain radiates from her neck into her shoulders pain with rotation only one way.  Some discomfort with flexion and extension.  She denies numbness or tingling shooting into her hands.  Pain radiates to  the medial border of both scapula superior medially.  No dysphonia she denies dysphasia.  Review of Systems 14 point update unchanged from last office visit.  She still has some problems with low back pain mild retrolisthesis at L3-4 and L4-5 with shallow disc protrusion at L4-5 and mild stenosis.  Otherwise negative is a pertains HPI.   Objective: Vital Signs: BP 122/78   Pulse 84   Physical Exam Constitutional:      Appearance: She is well-developed.  HENT:     Head: Normocephalic.     Right Ear: External ear normal.     Left Ear: External ear normal.  Eyes:     Pupils: Pupils are equal, round, and reactive to light.  Neck:     Thyroid: No thyromegaly.     Trachea: No tracheal deviation.  Cardiovascular:     Rate and Rhythm: Normal rate.  Pulmonary:     Effort: Pulmonary effort is normal.  Abdominal:     Palpations: Abdomen is soft.  Skin:    General: Skin is warm and dry.  Neurological:     Mental Status: She is alert and oriented to person, place, and time.  Psychiatric:        Behavior: Behavior normal.     Ortho Exam well-healed anterior cervical incision mild brachial plexus  tenderness on the left negative on the right.  Limitation of rotation.  Well-healed trigger thumb on the incision on the right without triggering.  Right trapezial tenderness mild and moderate on the left.  40 degrees rotation on the right 10 degrees on the left with increased pain.  Upper extremity reflexes are 2+ and symmetrical knee and ankle jerk are 2+ and symmetrical no lower extremity isolated weakness.  Specialty Comments:  No specialty comments available.  Imaging: CLINICAL DATA:  Recurrent neck pain, shoulder pain, and headache. Prior cervical fusion.  EXAM: MRI CERVICAL SPINE WITHOUT CONTRAST  TECHNIQUE: Multiplanar, multisequence MR imaging of the cervical spine was performed. No intravenous contrast was administered.  COMPARISON:  08/27/2016  FINDINGS: Alignment:  Chronic cervical spine straightening.  No listhesis.  Vertebrae: Interval C5-C7 ACDF. No fracture, suspicious osseous lesion, or significant marrow edema.  Cord: Normal signal and morphology.  Posterior Fossa, vertebral arteries, paraspinal tissues: Unremarkable aside from chronic lingual tonsillar hypertrophy.  Disc levels:  C2-3: Mild uncovertebral spurring without stenosis, unchanged.  C3-4: Disc bulging, shallow right paracentral to medial right foraminal disc protrusion/disc osteophyte complex, and uncovertebral spurring result in moderate right and mild-to-moderate left neural foraminal stenosis, mildly worsened on the right. No spinal stenosis.  C4-5: Minimal uncovertebral spurring without stenosis, unchanged.  C5-6: Interval ACDF. Right uncovertebral spurring results in moderate right neural foraminal stenosis. Patent spinal canal and left neural foramen.  C6-7: Interval ACDF. Uncovertebral spurring results in mild bilateral neural foraminal stenosis. Patent spinal canal.  C7-T1: Negative.  IMPRESSION: 1. Mild worsening of moderate right neural foraminal stenosis at C3-4 due to disc and osteophyte. 2. Interval C5-C7 ACDF with mild-to-moderate neural foraminal stenosis as above. No spinal stenosis.   Electronically Signed   By: Sebastian AcheAllen  Grady M.D.   On: 06/26/2018 11:06    PMFS History: Patient Active Problem List   Diagnosis Date Noted  . Chronic right-sided low back pain 11/09/2016  . Cervical spinal stenosis 09/24/2016  . Other spondylosis with radiculopathy, cervical region 08/23/2016   Past Medical History:  Diagnosis Date  . Acid reflux   . Arthritis   . Cervical stenosis of spine    cervical 5-7  . Esophageal ulcer   . Fibromyalgia   . History of bladder problems   . Hypothyroidism   . IBS (irritable bowel syndrome)   . Migraines   . Osteoarthritis   . Osteoporosis     Family History  Problem Relation Age of Onset  . Cancer  Mother   . Heart disease Father   . Stroke Father     Past Surgical History:  Procedure Laterality Date  . ABDOMINAL HYSTERECTOMY    . ANTERIOR CERVICAL DECOMP/DISCECTOMY FUSION N/A 09/24/2016   Procedure: C5-6, C6-7 Anterior Cervical Discectomy and Fusion, Allograft, Plate;  Surgeon: Eldred MangesMark C Yates, MD;  Location: [facility] OR;  Service: Orthopedics;  Laterality: N/A;  . APPENDECTOMY    . CARPAL TUNNEL RELEASE     B/L  . COLONOSCOPY W/ BIOPSIES AND POLYPECTOMY    . cubital release Right   . LAPAROSCOPIC CHOLECYSTECTOMY    . SHOULDER SURGERY Left   . SHOULDER SURGERY Right   . SHOULDER SURGERY Right    Social History   Occupational History  . Not on file  Tobacco Use  . Smoking status: Current Every Day Smoker    Packs/day: 0.50    Types: Cigarettes  . Smokeless tobacco: Never Used  Substance and Sexual Activity  . Alcohol use: No  . Drug  use: No  . Sexual activity: Not on file

## 2018-07-10 ENCOUNTER — Ambulatory Visit
Admission: RE | Admit: 2018-07-10 | Discharge: 2018-07-10 | Disposition: A | Discharge status: Home / Self Care | Payer: 59 | Source: Ambulatory Visit | Attending: Orthopaedic Surgery | Admitting: Orthopaedic Surgery

## 2018-07-10 DIAGNOSIS — M542 Cervicalgia: Secondary | ICD-10-CM

## 2018-07-24 ENCOUNTER — Ambulatory Visit (INDEPENDENT_AMBULATORY_CARE_PROVIDER_SITE_OTHER): Payer: BLUE CROSS/BLUE SHIELD | Admitting: Orthopaedic Surgery

## 2018-07-24 ENCOUNTER — Encounter (INDEPENDENT_AMBULATORY_CARE_PROVIDER_SITE_OTHER): Payer: Self-pay | Admitting: Orthopaedic Surgery

## 2018-07-24 VITALS — BP 134/97 | HR 90 | Ht 66.0 in | Wt 180.0 lb

## 2018-07-24 DIAGNOSIS — Z981 Arthrodesis status: Secondary | ICD-10-CM

## 2018-07-24 NOTE — Progress Notes (Signed)
Office Visit Note   Patient: Miranda Proctor           Date of Birth: 1961-02-10           MRN: [id number] Visit Date: 07/24/2018              Requested by: Primus BravoGibson, Tiffany, NP [address] [place], [place] [address] PCP: Primus BravoGibson, Tiffany, NP   Assessment & Plan: Visit Diagnoses:  1. S/P cervical spinal fusion     Plan: We discussed continued working on exercise and walking program stretching program.  She has some foraminal stenosis at C3-4 2 levels above her solid fusion but at this point no surgery is recommended.  We discussed options including disc arthroplasty.  I plan to recheck her in 5 months.  Follow-Up Instructions: Return in about 5 months (around 12/23/2018).   Orders:  No orders of the defined types were placed in this encounter.  No orders of the defined types were placed in this encounter.     Procedures: No procedures performed   Clinical Data: No additional findings.   Subjective: Chief Complaint  Patient presents with  . Neck - Follow-up    CT Cervical Spine Review    HPI 57 year old female returns with ongoing problems with her neck she has an area upper right cervical that bothers her particularly when she turns to the left.  She also has some discomfort with outstretched reaching in the posterior cervical region.  She also has some pain that radiates into the base of the trapezius.  No lower extremity numbness or weakness associated.  Originally fell off a truck back in March 2018 she had 2 level cervical fusion which is healed solidly.  CT scan is available which shows solid fusion at 2 levels.  She does have advanced foraminal stenosis at C3-4 worse in the right and left.   Review of Systems updated unchanged from 07/05/2018 office visit.   Objective: Vital Signs: BP (!) 134/97   Pulse 90   Ht 5\' 6"  (1.676 m)   Wt 180 lb (81.6 kg)   BMI 29.05 kg/m   Physical Exam Constitutional:      Appearance: She is well-developed.  HENT:   Head: Normocephalic.     Right Ear: External ear normal.     Left Ear: External ear normal.  Eyes:     Pupils: Pupils are equal, round, and reactive to light.  Neck:     Thyroid: No thyromegaly.     Trachea: No tracheal deviation.  Cardiovascular:     Rate and Rhythm: Normal rate.  Pulmonary:     Effort: Pulmonary effort is normal.  Abdominal:     Palpations: Abdomen is soft.  Skin:    General: Skin is warm and dry.  Neurological:     Mental Status: She is alert and oriented to person, place, and time.  Psychiatric:        Behavior: Behavior normal.     Ortho Exam healed anterior incision.  Some discomfort rotation of the left.  Upper extremity reflexes are 2+ and symmetrical normal heel toe gait.  No lower extremity clonus.  Tenderness over the right paracentral upper cervical spine.  Specialty Comments:  No specialty comments available.  Imaging: CLINICAL DATA:  Arthritis and neck pain with burning since surgery March 2018.  EXAM: CT CERVICAL SPINE WITHOUT CONTRAST  TECHNIQUE: Multidetector CT imaging of the cervical spine was performed without intravenous contrast. Multiplanar CT image reconstructions were also generated.  COMPARISON:  Cervical MRI 06/26/2018  FINDINGS: Alignment: Mild dextrocurvature centered at the cervicothoracic junction. No listhesis  Skull base and vertebrae: No acute fracture. No primary bone lesion or focal pathologic process. ACDF with solid arthrodesis at C5-6 and C6-7.  Soft tissues and spinal canal: No evident collection or inflammation  Disc levels:  C2-3: Unremarkable.  C3-4: Disc narrowing with right more than left uncovertebral spurring. There is moderate to advanced biforaminal stenosis.  C4-5: Disc narrowing with mild left uncovertebral spurring and left foraminal stenosis  C5-6: ACDF with solid arthrodesis. Mild-to-moderate residual right foraminal narrowing from uncovertebral spur  C6-7: ACDF with  solid arthrodesis. Mild bilateral foraminal narrowing from uncovertebral ridging.  C7-T1:Unremarkable.  Upper chest: Negative  IMPRESSION: 1. C5-6 and C6-7 ACDF with solid arthrodesis. C5-6 mild or moderate residual right foraminal narrowing from spur. 2. C3-4 biforaminal stenosis from disc height loss and uncovertebral ridging.   Electronically Signed   By: Marnee SpringJonathon  Watts M.D.   On: 07/10/2018 12:14   PMFS History: Patient Active Problem List   Diagnosis Date Noted  . Chronic right-sided low back pain 11/09/2016  . Cervical spinal stenosis 09/24/2016  . Other spondylosis with radiculopathy, cervical region 08/23/2016   Past Medical History:  Diagnosis Date  . Acid reflux   . Arthritis   . Cervical stenosis of spine    cervical 5-7  . Esophageal ulcer   . Fibromyalgia   . History of bladder problems   . Hypothyroidism   . IBS (irritable bowel syndrome)   . Migraines   . Osteoarthritis   . Osteoporosis     Family History  Problem Relation Age of Onset  . Cancer Mother   . Heart disease Father   . Stroke Father     Past Surgical History:  Procedure Laterality Date  . ABDOMINAL HYSTERECTOMY    . ANTERIOR CERVICAL DECOMP/DISCECTOMY FUSION N/A 09/24/2016   Procedure: C5-6, C6-7 Anterior Cervical Discectomy and Fusion, Allograft, Plate;  Surgeon: Eldred MangesMark C Angelyne Terwilliger, MD;  Location: [facility] OR;  Service: Orthopedics;  Laterality: N/A;  . APPENDECTOMY    . CARPAL TUNNEL RELEASE     B/L  . COLONOSCOPY W/ BIOPSIES AND POLYPECTOMY    . cubital release Right   . LAPAROSCOPIC CHOLECYSTECTOMY    . SHOULDER SURGERY Left   . SHOULDER SURGERY Right   . SHOULDER SURGERY Right    Social History   Occupational History  . Not on file  Tobacco Use  . Smoking status: Current Every Day Smoker    Packs/day: 0.50    Types: Cigarettes  . Smokeless tobacco: Never Used  Substance and Sexual Activity  . Alcohol use: No  . Drug use: No  . Sexual activity: Not on file

## 2018-07-25 ENCOUNTER — Ambulatory Visit (INDEPENDENT_AMBULATORY_CARE_PROVIDER_SITE_OTHER): Payer: BLUE CROSS/BLUE SHIELD | Admitting: Orthopaedic Surgery

## 2019-08-20 ENCOUNTER — Telehealth: Payer: Self-pay | Admitting: Orthopaedic Surgery

## 2019-08-20 NOTE — Telephone Encounter (Signed)
Patient called.   She was seen by Miranda Proctor for her neck months ago and scheduled an appointment for tomorrow regarding the same area. She wanted to know if he is going to require she get another imaging done beforehand.  Call back number: [phone]

## 2019-08-20 NOTE — Telephone Encounter (Signed)
I called patient and advised we would get new x-rays in the office, however, we will not know if additional imaging is needed until she is seen in the office. Patient expressed understanding.

## 2019-08-21 ENCOUNTER — Other Ambulatory Visit: Payer: Self-pay

## 2019-08-21 ENCOUNTER — Ambulatory Visit (INDEPENDENT_AMBULATORY_CARE_PROVIDER_SITE_OTHER): Payer: Medicare Other

## 2019-08-21 ENCOUNTER — Ambulatory Visit (INDEPENDENT_AMBULATORY_CARE_PROVIDER_SITE_OTHER): Payer: Medicare Other | Admitting: Orthopaedic Surgery

## 2019-08-21 ENCOUNTER — Encounter: Payer: Self-pay | Admitting: Orthopaedic Surgery

## 2019-08-21 VITALS — BP 143/93 | HR 85 | Ht 66.0 in | Wt 180.0 lb

## 2019-08-21 DIAGNOSIS — M542 Cervicalgia: Secondary | ICD-10-CM

## 2019-08-21 DIAGNOSIS — M7062 Trochanteric bursitis, left hip: Secondary | ICD-10-CM | POA: Diagnosis not present

## 2019-08-21 DIAGNOSIS — Z981 Arthrodesis status: Secondary | ICD-10-CM | POA: Diagnosis not present

## 2019-08-21 MED ORDER — CYCLOBENZAPRINE HCL 10 MG PO TABS: 10.0000 mg | ORAL_TABLET | Freq: Every day | ORAL | 0 refills | Status: DC

## 2019-08-21 NOTE — Progress Notes (Signed)
Office Visit Note   Patient: Miranda Proctor           Date of Birth: 1961-04-15           MRN: [id number] Visit Date: 08/21/2019              Requested by: Nena Polio, NP [address] [place],  [place] [address] PCP: Nena Polio, NP   Assessment & Plan: Visit Diagnoses:  1. Neck pain   2. S/P cervical spinal fusion   3. Trochanteric bursitis, left hip     Plan: We discussed that she is lifting her grandchild up some much it is bothering either her previous rotator cuff repairs with the right side been operated on twice and may also be aggravating her neck.  2 levels had significant disease have been fused and are solid on x-ray.  She has pain posteriorly at the base of the cervical spine and is likely doing more lifting than she is physically able to do.  We injected her trochanteric bursa on the left which gave her good relief she is walking better.  We will send in some Flexeril she can try this at night.  Follow-Up Instructions: No follow-ups on file.   Orders:  Orders Placed This Encounter  Procedures   Large Joint Inj: L greater trochanter   XR Cervical Spine 2 or 3 views   Meds ordered this encounter  Medications   cyclobenzaprine (FLEXERIL) 10 MG tablet    Sig: Take 1 tablet (10 mg total) by mouth at bedtime.    Dispense:  30 tablet    Refill:  0      Procedures: Large Joint Inj: L greater trochanter on 08/21/2019 3:06 PM Details: lateral approach Medications: 0.5 mL lidocaine 1 %; 2 mL bupivacaine 0.25 %; 40 mg methylPREDNISolone acetate 40 MG/ML      Clinical Data: No additional findings.   Subjective: Chief Complaint  Patient presents with   Neck - Pain    09/24/2016 C5-6, C6-7 ACDF, Allograft, Plate    HPI 59 year old female returns with ongoing posterior neck pain and shoulder pain.  She has had 2 shoulder procedures on the right shoulder with rotator cuff repair and one rotator cuff repair on the left.  She has been taking care of  her 38-month-old grandchild several days a week and has to lift him to change diapers etc.  He likes to be carried and she has had increased pain.  She is taking gabapentin at night sometimes to just to sleep she has been tearful crying.  She states she is also having increased pain over the left trochanter which is causing her to limp.  Patient had previous two-level cervical fusion which looks well-healed by x-ray.  She had sore throat during the summer and was told she had an inflamed lymph node on the right side and she has an ultrasound coming up. Previous cervical fusion 09/24/2016 C5-6 C6-7.  She has had MRI after scan 06/26/2018 which showed no cervical soft tissue lesions.  Patient is tearful crying and feels like she is not able to take care of her grandchild and feels like she needs some help. Review of Systems 14 point review of systems updated.  Patient has been on Prozac.  Otherwise negative other than as mentioned in HPI.  Objective: Vital Signs: BP (!) 143/93    Pulse 85    Ht 5\' 6"  (1.676 m)    Wt 180 lb (81.6 kg)    BMI  29.05 kg/m   Physical Exam Constitutional:      Appearance: She is well-developed.  HENT:     Head: Normocephalic.     Right Ear: External ear normal.     Left Ear: External ear normal.  Eyes:     Pupils: Pupils are equal, round, and reactive to light.  Neck:     Thyroid: No thyromegaly.     Trachea: No tracheal deviation.  Cardiovascular:     Rate and Rhythm: Normal rate.  Pulmonary:     Effort: Pulmonary effort is normal.  Abdominal:     Palpations: Abdomen is soft.  Skin:    General: Skin is warm and dry.  Neurological:     Mental Status: She is alert and oriented to person, place, and time.  Psychiatric:        Behavior: Behavior normal.     Ortho Exam patient has extreme tenderness over both shoulders long head of the biceps tendon mid humerus region.  She is crying and tearful with palpation over the clavicles.  Upper extremity reflexes are 2+  and symmetrical.  Exquisite tenderness over the left trochanter no tenderness over the right.  Negative logroll to the hips knees reach full extension knee and ankle jerk intact.  Specialty Comments:  No specialty comments available.  Imaging: No results found.   PMFS History: Patient Active Problem List   Diagnosis Date Noted   S/P cervical spinal fusion 08/21/2019   Trochanteric bursitis, left hip 08/21/2019   Chronic right-sided low back pain 11/09/2016   Past Medical History:  Diagnosis Date   Acid reflux    Arthritis    Cervical stenosis of spine    cervical 5-7   Esophageal ulcer    Fibromyalgia    History of bladder problems    Hypothyroidism    IBS (irritable bowel syndrome)    Migraines    Osteoarthritis    Osteoporosis     Family History  Problem Relation Age of Onset   Cancer Mother    Heart disease Father    Stroke Father     Past Surgical History:  Procedure Laterality Date   ABDOMINAL HYSTERECTOMY     ANTERIOR CERVICAL DECOMP/DISCECTOMY FUSION N/A 09/24/2016   Procedure: C5-6, C6-7 Anterior Cervical Discectomy and Fusion, Allograft, Plate;  Surgeon: Eldred Manges, MD;  Location: [facility] OR;  Service: Orthopedics;  Laterality: N/A;   APPENDECTOMY     CARPAL TUNNEL RELEASE     B/L   COLONOSCOPY W/ BIOPSIES AND POLYPECTOMY     cubital release Right    LAPAROSCOPIC CHOLECYSTECTOMY     SHOULDER SURGERY Left    SHOULDER SURGERY Right    SHOULDER SURGERY Right    Social History   Occupational History   Not on file  Tobacco Use   Smoking status: Current Every Day Smoker    Packs/day: 0.50    Types: Cigarettes   Smokeless tobacco: Never Used  Substance and Sexual Activity   Alcohol use: No   Drug use: No   Sexual activity: Not on file

## 2019-08-22 ENCOUNTER — Encounter: Payer: Self-pay | Admitting: Orthopaedic Surgery

## 2019-08-22 MED ORDER — BUPIVACAINE HCL 0.25 % IJ SOLN
2.0000 mL | INTRAMUSCULAR | Status: AC | PRN
  Administered 2019-08-21: 15:00:00 2 mL via INTRA_ARTICULAR

## 2019-08-22 MED ORDER — LIDOCAINE HCL 1 % IJ SOLN
0.5000 mL | INTRAMUSCULAR | Status: AC | PRN
  Administered 2019-08-21: 15:00:00 .5 mL

## 2019-08-22 MED ORDER — METHYLPREDNISOLONE ACETATE 40 MG/ML IJ SUSP
40.0000 mg | INTRAMUSCULAR | Status: AC | PRN
  Administered 2019-08-21: 15:00:00 40 mg via INTRA_ARTICULAR

## 2019-10-20 ENCOUNTER — Other Ambulatory Visit: Payer: Self-pay | Admitting: Orthopaedic Surgery

## 2019-10-22 NOTE — Telephone Encounter (Signed)
Please advise 

## 2019-11-07 ENCOUNTER — Other Ambulatory Visit: Payer: Self-pay | Admitting: Orthopaedic Surgery

## 2019-11-07 NOTE — Telephone Encounter (Signed)
Time to stop . Used for short term use. [place] thanks

## 2019-11-07 NOTE — Telephone Encounter (Signed)
Please advise 

## 2019-12-02 ENCOUNTER — Other Ambulatory Visit: Payer: Self-pay | Admitting: Orthopaedic Surgery

## 2019-12-03 NOTE — Telephone Encounter (Signed)
Ok to rf? 

## 2019-12-17 ENCOUNTER — Telehealth: Payer: Self-pay

## 2019-12-17 NOTE — Telephone Encounter (Signed)
Miranda Proctor with Dr. [facility] office would like a note/letter faxed for pre-Dental antibiotics if needed.  Fax# [phone].  CB# [phone].  Please advise.  Thank you.

## 2019-12-18 NOTE — Telephone Encounter (Signed)
Note faxed.

## 2020-01-15 ENCOUNTER — Other Ambulatory Visit: Payer: Self-pay | Admitting: Orthopaedic Surgery

## 2020-01-16 NOTE — Telephone Encounter (Signed)
Please advise 

## 2020-02-10 ENCOUNTER — Other Ambulatory Visit: Payer: Self-pay | Admitting: Orthopaedic Surgery

## 2020-02-11 NOTE — Telephone Encounter (Signed)
If pt called and requested OK for one final refill, if from surescript and pt did not request Rx then deny thanks

## 2020-02-11 NOTE — Telephone Encounter (Signed)
Please advise 

## 2020-04-03 IMAGING — CT CT CERVICAL SPINE W/O CM
3 of 4 series · 13 of 33 positions shown, 16 images · non-contrast
Comparison: Cervical MRI 06/26/2018

CLINICAL DATA: Arthritis and neck pain with burning since surgery
September 2016.

EXAM:
CT CERVICAL SPINE WITHOUT CONTRAST
TECHNIQUE: Multidetector CT imaging of the cervical spine was performed without
intravenous contrast. Multiplanar CT image reconstructions were also
generated.

[Series 4: c-spine 2.00 br40 s3 axial (person_name) · axial · 0.28mm/px · z∈[-827,-717]mm · 5 of 83 slices shown, 7 images]
[im 14/83  soft-tissue]
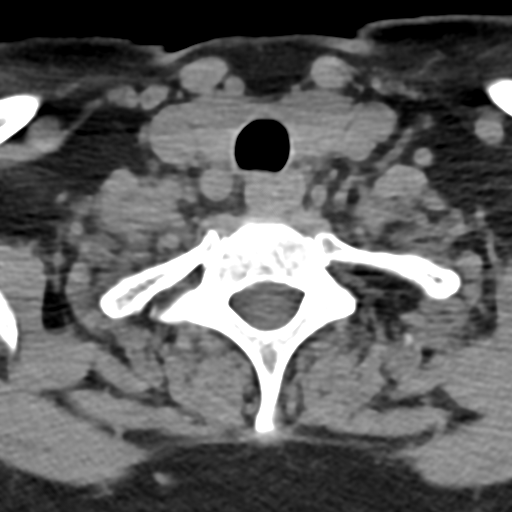
[im 14/83  bone]
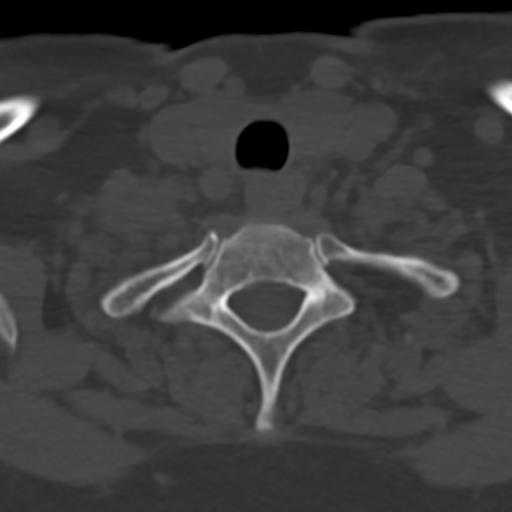
[im 28/83  bone]
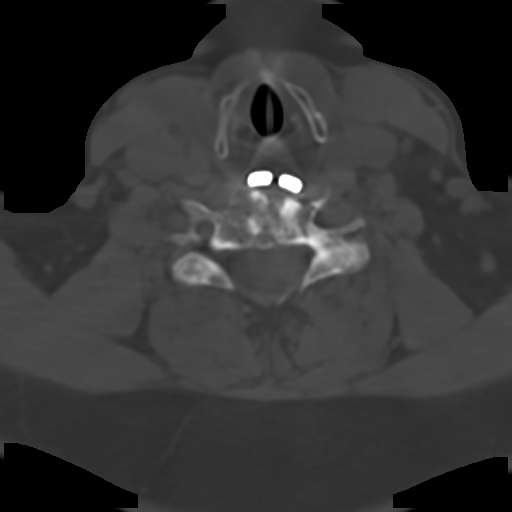
[im 42/83  bone]
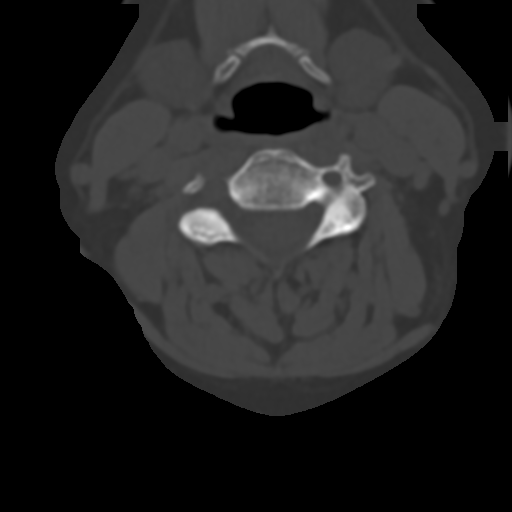
[im 55/83  bone]
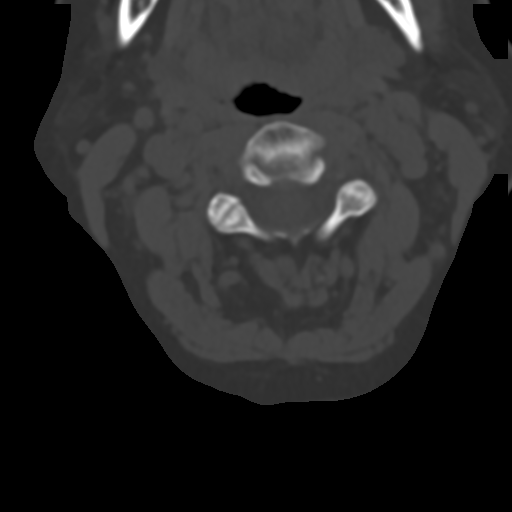
[im 69/83  soft-tissue]
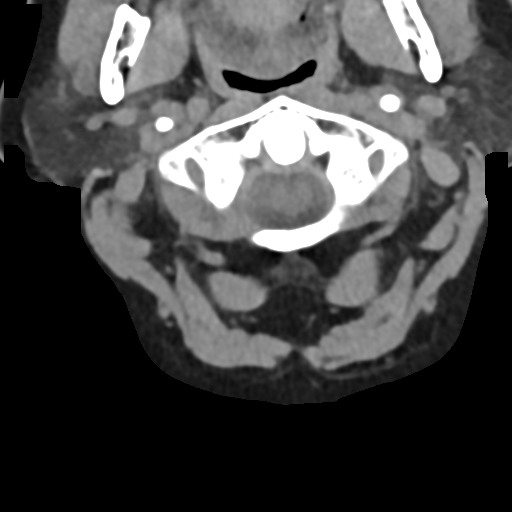
[im 69/83  bone]
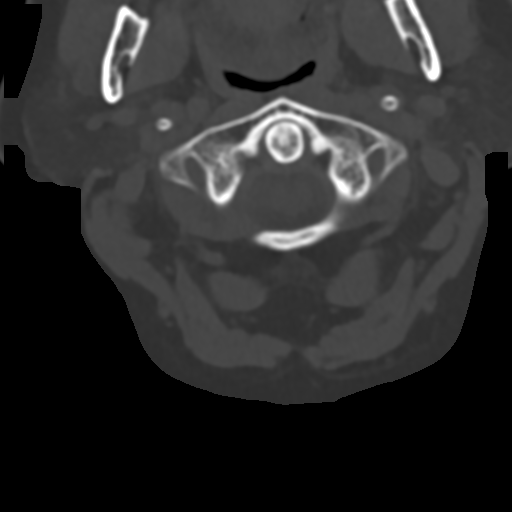

[Series 5: c-spine 2.00 br60 s3 sag sag bone · sagittal · 0.28mm/px · 5 of 68 slices shown, 6 images]
[im 23/68  bone]
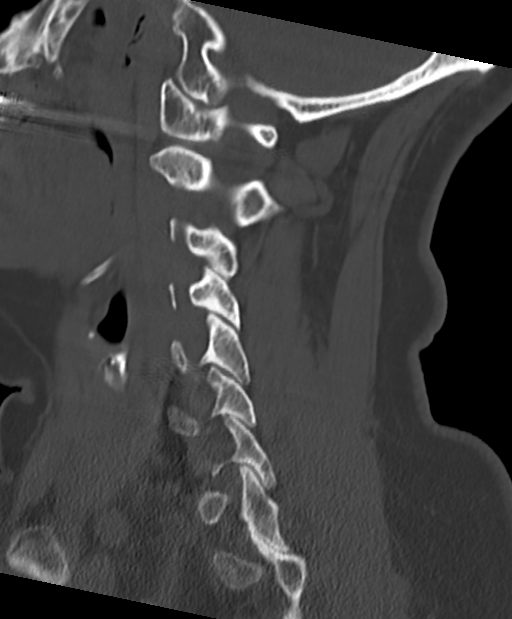
[im 28/68  bone]
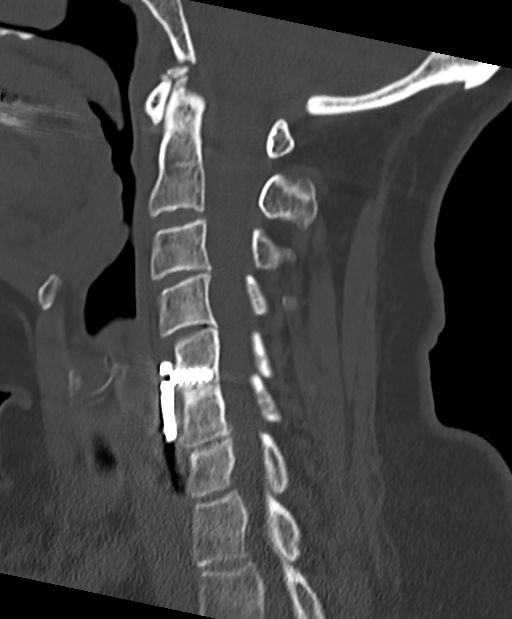
[im 34/68  soft-tissue]
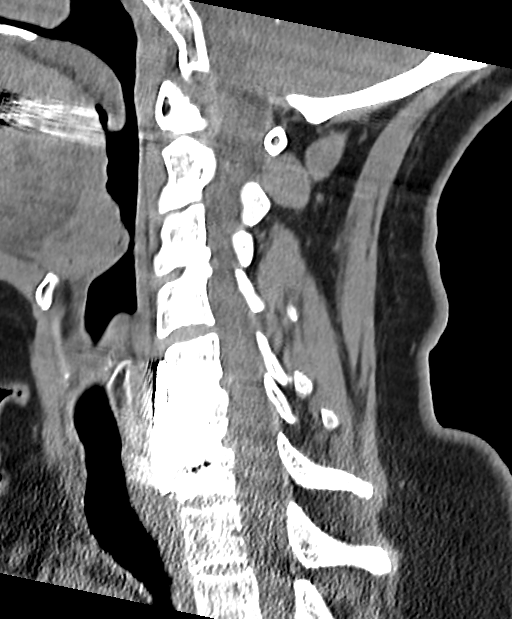
[im 34/68  bone]
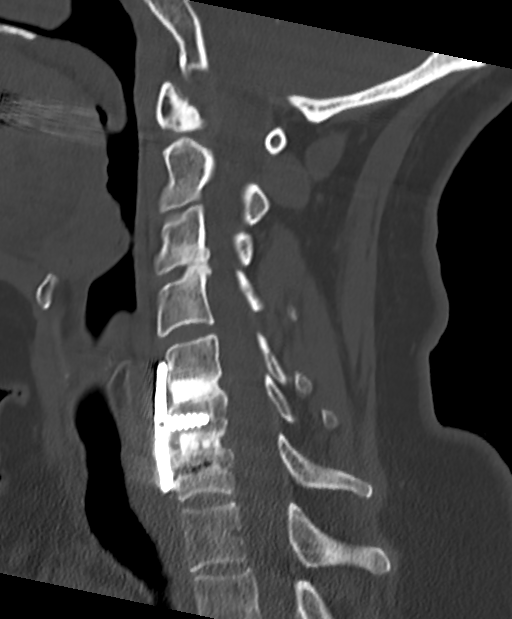
[im 40/68  bone]
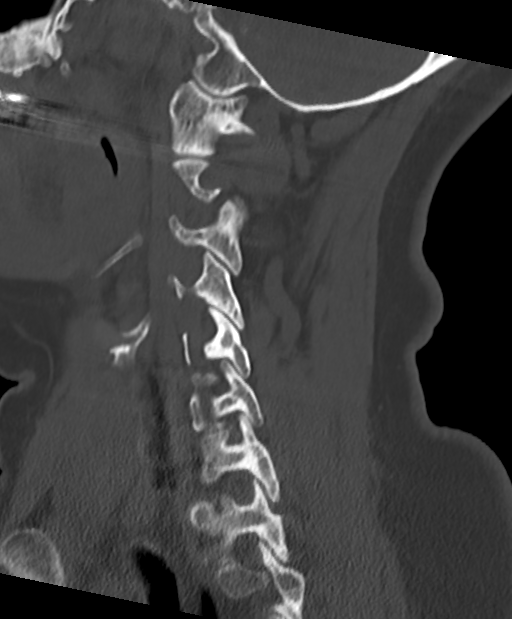
[im 45/68  bone]
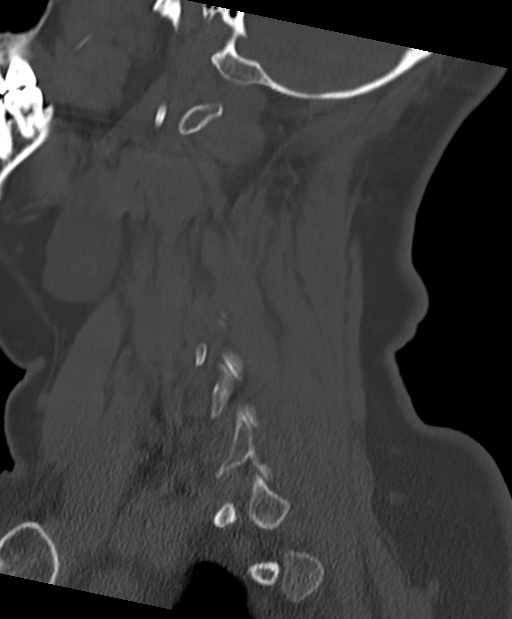

[Series 7: c-spine 2.00 hr60 s3 cor cor bone · coronal · 0.28mm/px · 3 of 71 slices shown]
[im 15/71  bone]
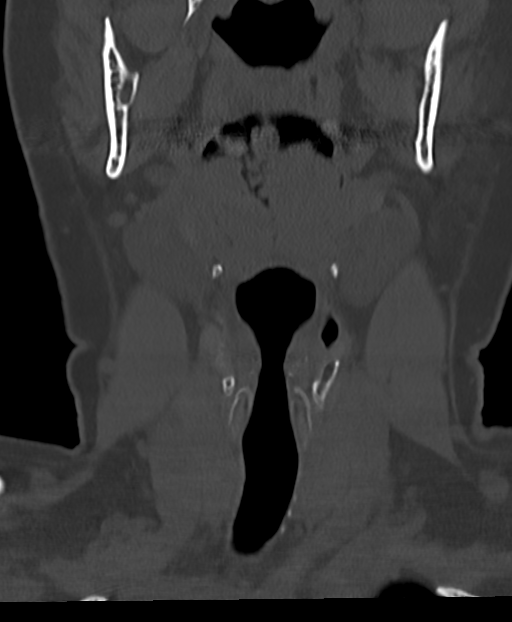
[im 29/71  bone]
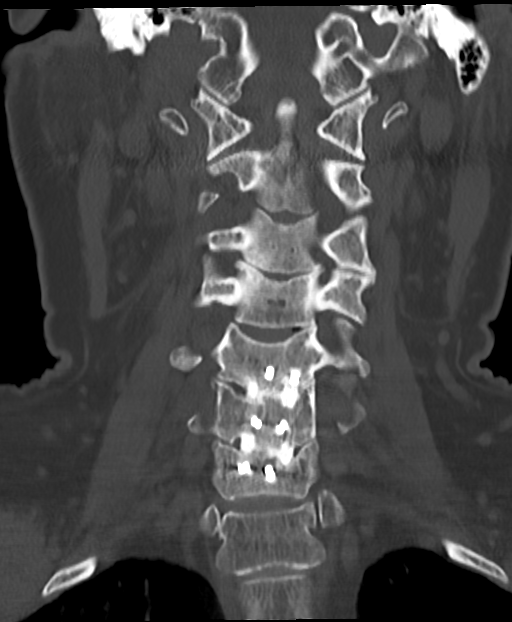
[im 43/71  bone]
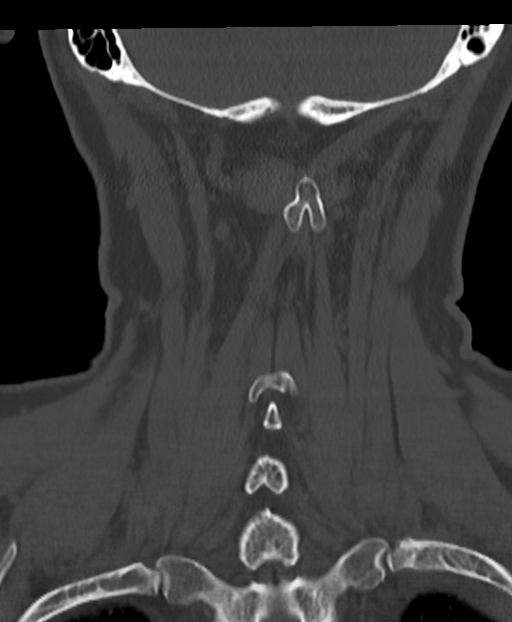

[13 of 33 positions shown; findings below may reference images not displayed]

FINDINGS: Alignment: Mild dextrocurvature centered at the cervicothoracic
junction. No listhesis

Skull base and vertebrae: No acute fracture. No primary bone lesion
or focal pathologic process. ACDF with solid arthrodesis at C5-6 and
C6-7.

Soft tissues and spinal canal: No evident collection or inflammation

Disc levels:

C2-3: Unremarkable.

C3-4: Disc narrowing with right more than left uncovertebral
spurring. There is moderate to advanced biforaminal stenosis.

C4-5: Disc narrowing with mild left uncovertebral spurring and left
foraminal stenosis

C5-6: ACDF with solid arthrodesis. Mild-to-moderate residual right
foraminal narrowing from uncovertebral spur

C6-7: ACDF with solid arthrodesis. Mild bilateral foraminal
narrowing from uncovertebral ridging.

C7-T1:Unremarkable.

Upper chest: Negative
IMPRESSION: 1. C5-6 and C6-7 ACDF with solid arthrodesis. C5-6 mild or moderate
residual right foraminal narrowing from spur.
2. C3-4 biforaminal stenosis from disc height loss and uncovertebral
ridging.
